# Patient Record
Sex: Female | Born: 1945
Health system: Southern US, Community
[De-identification: ages and names within clinical notes are randomized; demographics above are authoritative.]

## PROBLEM LIST (undated history)

## (undated) DIAGNOSIS — I10 Essential (primary) hypertension: Secondary | ICD-10-CM

## (undated) DIAGNOSIS — M1990 Unspecified osteoarthritis, unspecified site: Secondary | ICD-10-CM

## (undated) DIAGNOSIS — E119 Type 2 diabetes mellitus without complications: Secondary | ICD-10-CM

## (undated) HISTORY — PX: BREAST EXCISIONAL BIOPSY: SUR124

## (undated) HISTORY — PX: TONSILLECTOMY: SUR1361

## (undated) HISTORY — PX: OTHER SURGICAL HISTORY: SHX169

---

## 1976-12-14 HISTORY — PX: BREAST BIOPSY: SHX20

## 2008-06-26 ENCOUNTER — Emergency Department (HOSPITAL_COMMUNITY): Admission: EM | Admit: 2008-06-26 | Discharge: 2008-06-26 | Payer: Self-pay | Admitting: Emergency Medicine

## 2016-01-07 ENCOUNTER — Ambulatory Visit (INDEPENDENT_AMBULATORY_CARE_PROVIDER_SITE_OTHER): Payer: Medicare Other

## 2016-01-07 ENCOUNTER — Ambulatory Visit (INDEPENDENT_AMBULATORY_CARE_PROVIDER_SITE_OTHER): Payer: Medicare Other | Admitting: Podiatry

## 2016-01-07 ENCOUNTER — Encounter: Payer: Self-pay | Admitting: Podiatry

## 2016-01-07 VITALS — BP 140/77 | HR 92 | Resp 12

## 2016-01-07 DIAGNOSIS — M722 Plantar fascial fibromatosis: Secondary | ICD-10-CM | POA: Diagnosis not present

## 2016-01-07 DIAGNOSIS — E1142 Type 2 diabetes mellitus with diabetic polyneuropathy: Secondary | ICD-10-CM | POA: Diagnosis not present

## 2016-01-07 DIAGNOSIS — E119 Type 2 diabetes mellitus without complications: Secondary | ICD-10-CM

## 2016-01-07 MED ORDER — GABAPENTIN 300 MG PO CAPS: 270.0000 mg | ORAL_CAPSULE | Freq: Three times a day (TID) | ORAL | Status: DC

## 2016-01-08 NOTE — Progress Notes (Signed)
She presents today as a new patient chief complaint of pain and itching and medialized general arch bilateral foot. States that she is non-insulin-dependent diabetic currently taking gabapentin for itching and pain in the medial longitudinal arch. She states that this is been going on for several months and no treatment has alleviated her symptoms.  Objective: Vital signs are stable she is alert and oriented 3. I have reviewed her past mental history medications allergies surgery social history and review of systems. No apparent distress. Pulses are strongly palpable bilateral. Neurologic sensorium is intact per Semmes-Weinstein monofilament the loss of protective vibratory sensation. Muscle strength is 5 over 5 dorsiflexion 5 flexors and inverters everters on physical musculatures intact orthopedic evaluation doesn't straight hammertoe deformities bilateral. She has pain on palpation medially continue tubercle bilateral. Radiographs do demonstrate soft tissue increase in density of the plantar fascial calcaneal insertion site left greater than right. No fractures are identified.  Assessment: Diabetes mellitus with diabetic peripheral neuropathy. Chronic proximal plantar fasciitis bilateral heels.  Plan: Discussed etiology pathology conservative versus surgical therapies. Started her on plantar fascial braces injected her bilateral heels with Kenalog and local anesthetic and we will follow-up with her in the next few weeks for reevaluation.

## 2016-02-04 ENCOUNTER — Encounter: Payer: Self-pay | Admitting: Podiatry

## 2016-02-04 ENCOUNTER — Ambulatory Visit (INDEPENDENT_AMBULATORY_CARE_PROVIDER_SITE_OTHER): Payer: Medicare Other | Admitting: Podiatry

## 2016-02-04 VITALS — BP 145/79 | HR 93 | Resp 16

## 2016-02-04 DIAGNOSIS — E1142 Type 2 diabetes mellitus with diabetic polyneuropathy: Secondary | ICD-10-CM | POA: Diagnosis not present

## 2016-02-04 DIAGNOSIS — M722 Plantar fascial fibromatosis: Secondary | ICD-10-CM | POA: Diagnosis not present

## 2016-02-04 NOTE — Progress Notes (Signed)
She presents today for a follow-up of her neuropathy which she states is doing much better but she continues to have [place] little bit of itching on the bottom of the foot. She states that she takes her gabapentin on a regular basis as prescribed and that has done very well for her. She also presents for follow-up of her plantar fasciitis which she states is approximately 90% resolved. She still has some pain to the plantar medial heel but is limited.  Objective: Vital signs are stable she is alert and oriented 3 she has pain on palpation medial calcaneal tubercle. Otherwise no change and physical findings.  Assessment: Pain in limb secondary to neuropathy as well as plantar fasciitis right greater than left.  Plan: Reinjected the right heel today with 20 mg of Kenalog and local anesthetic. Encouraged her to continue her plantar fascial brace and night splint. I encouraged her to continue her gabapentin on a regular basis and notify us when the need should arise for refills.

## 2016-03-09 ENCOUNTER — Other Ambulatory Visit: Payer: Self-pay

## 2016-03-09 DIAGNOSIS — Z1231 Encounter for screening mammogram for malignant neoplasm of breast: Secondary | ICD-10-CM

## 2016-03-10 ENCOUNTER — Ambulatory Visit
Admission: RE | Admit: 2016-03-10 | Discharge: 2016-03-10 | Disposition: A | Discharge status: Home / Self Care | Payer: Medicare Other | Source: Ambulatory Visit

## 2016-03-10 DIAGNOSIS — Z1231 Encounter for screening mammogram for malignant neoplasm of breast: Secondary | ICD-10-CM

## 2016-04-02 ENCOUNTER — Encounter: Payer: Self-pay | Admitting: Podiatry

## 2016-04-02 ENCOUNTER — Ambulatory Visit (INDEPENDENT_AMBULATORY_CARE_PROVIDER_SITE_OTHER): Payer: Medicare Other | Admitting: Podiatry

## 2016-04-02 DIAGNOSIS — M722 Plantar fascial fibromatosis: Secondary | ICD-10-CM

## 2016-04-02 DIAGNOSIS — Q828 Other specified congenital malformations of skin: Secondary | ICD-10-CM | POA: Diagnosis not present

## 2016-04-02 DIAGNOSIS — M779 Enthesopathy, unspecified: Secondary | ICD-10-CM | POA: Diagnosis not present

## 2016-04-02 DIAGNOSIS — E1142 Type 2 diabetes mellitus with diabetic polyneuropathy: Secondary | ICD-10-CM

## 2016-04-04 NOTE — Progress Notes (Signed)
She presents today with a chief complaint of a painful lesion sub-fifth metatarsal of the right foot. She is also following up for plantar fasciitis bilateral. And diabetic peripheral neuropathy. He continues her gabapentin on a regular basis and states that she still has a little bit of itching on occasion. She states that she notices it to be worse with her elevated blood sugars. She states that the plantar fasciitis is doing much better but she would like to have me to trim the fifth metatarsal head area where calluses present.  Objective: Vital signs are stable alert and oriented 3. Pulses are palpable. Neurologic sensorium is intact per Semmes-Weinstein monofilament. No pain on palpation medial calcaneal tubercles bilateral. Reactive hyperkeratosis of fifth metatarsal head of the right foot with pain on end range of motion of the fifth metatarsophalangeal joint right foot.  Assessment: Diabetes mellitus with diabetic peripheral neuropathy. Resolving plantar fasciitis bilateral. Capsulitis fifth metatarsophalangeal joint right foot. This is possibly associated with the porokeratotic lesion plantar aspect of the fifth metatarsal head right foot.  Plan: Debrided all reactive hyperkeratosis today injected the fifth metatarsophalangeal joint today with dexamethasone and local anesthetic beneath the joint itself.

## 2016-05-28 ENCOUNTER — Ambulatory Visit (INDEPENDENT_AMBULATORY_CARE_PROVIDER_SITE_OTHER): Payer: Medicare Other | Admitting: Podiatry

## 2016-05-28 ENCOUNTER — Encounter: Payer: Self-pay | Admitting: Podiatry

## 2016-05-28 DIAGNOSIS — E1142 Type 2 diabetes mellitus with diabetic polyneuropathy: Secondary | ICD-10-CM

## 2016-05-28 DIAGNOSIS — M779 Enthesopathy, unspecified: Secondary | ICD-10-CM

## 2016-05-30 NOTE — Progress Notes (Signed)
She presents today for follow-up of capsulitis first metatarsophalangeal joint of the right foot where she has significant osteoarthritic change. She also takes gabapentin which seems to be helping but recently she's noted more pain in the forefoot so she really couldn't tell whether or not the gabapentin was doing any good. She says it seems to have lost its efficacy but the pain is different than it was.  Objective: Vital signs are stable she is alert and oriented 3. Pulses are palpable. Severe osteoarthritis first metatarsophalangeal joint. Hallux limitus first metatarsophalangeal joint. An diabetic peripheral neuropathy is painful.  Assessment: Diabetes mellitus with neuropathy. Moderate to severe capsulitis hallux limitus first metatarsophalangeal joint bilateral.  Plan: After sterile Betadine skin prep injected the areas today with dexamethasone and local anesthetic I recommended that we leave her gabapentin alone at 300 mg 3 times a day and I will follow-up with her in 1 month or so to recheck the gabapentin.

## 2016-06-02 ENCOUNTER — Ambulatory Visit: Payer: Medicare Other | Admitting: Podiatry

## 2016-07-02 ENCOUNTER — Encounter: Payer: Self-pay | Admitting: Podiatry

## 2016-07-02 ENCOUNTER — Ambulatory Visit (INDEPENDENT_AMBULATORY_CARE_PROVIDER_SITE_OTHER): Payer: Medicare Other | Admitting: Podiatry

## 2016-07-02 ENCOUNTER — Ambulatory Visit: Payer: Medicare Other | Admitting: Podiatry

## 2016-07-02 DIAGNOSIS — E1142 Type 2 diabetes mellitus with diabetic polyneuropathy: Secondary | ICD-10-CM | POA: Diagnosis not present

## 2016-07-02 MED ORDER — GABAPENTIN (ONCE-DAILY) 600 MG PO TABS: ORAL_TABLET | ORAL | Status: DC

## 2016-07-04 NOTE — Progress Notes (Signed)
She presents today for follow-up of her painful neuropathy. She states that she has good days and bad days however she is not sure that the 300 mg 3 times a day is actually helping her.  Objective: Vital signs are stable she is alert and oriented 3. No change in physical exam.  Assessment: Diabetic peripheral neuropathy.  Plan: I increased her gabapentin 600 mg 3 times a day I will follow up with her in 1 month to 6 weeks.

## 2016-07-07 ENCOUNTER — Telehealth: Payer: Self-pay | Admitting: *Deleted

## 2016-07-07 MED ORDER — GABAPENTIN 300 MG PO CAPS: 300.0000 mg | ORAL_CAPSULE | Freq: Three times a day (TID) | ORAL | 0 refills | Status: DC

## 2016-07-07 NOTE — Telephone Encounter (Signed)
Prior authorization request for [place] received.  Dr. Al Corpus states he did not order for pt, pt is to be on Gabapentin 300mg  #270 one capsule tid. Orders changed and escribed to [facility].

## 2016-07-30 ENCOUNTER — Ambulatory Visit: Payer: Medicare Other | Admitting: Podiatry

## 2016-08-27 ENCOUNTER — Ambulatory Visit (INDEPENDENT_AMBULATORY_CARE_PROVIDER_SITE_OTHER): Payer: Medicare Other | Admitting: Podiatry

## 2016-08-27 ENCOUNTER — Encounter: Payer: Self-pay | Admitting: Podiatry

## 2016-08-27 DIAGNOSIS — E1142 Type 2 diabetes mellitus with diabetic polyneuropathy: Secondary | ICD-10-CM | POA: Diagnosis not present

## 2016-08-29 NOTE — Progress Notes (Signed)
She presents today for follow-up of her gabapentin. 300 mg take 2 tablets 3 times a day total of 1800 mg daily. She states that she seems to be doing better when she takes that amount. That she still gets itching in her hands and her feet.  Objective: Vital signs are stable she is alert and oriented 3 no change in physical exam. Pulses remain palpable area  Assessment: Diabetic peripheral neuropathy bilateral.  Plan: Continue 1800 mg of gabapentin daily. We also requested a topical compound to be applied to the feet 2-3 times a day. This was ordered today and I will follow-up with her on an as-needed basis.

## 2017-02-03 ENCOUNTER — Other Ambulatory Visit: Payer: Self-pay | Admitting: Family Medicine

## 2017-02-03 DIAGNOSIS — Z1231 Encounter for screening mammogram for malignant neoplasm of breast: Secondary | ICD-10-CM

## 2017-03-15 ENCOUNTER — Ambulatory Visit (INDEPENDENT_AMBULATORY_CARE_PROVIDER_SITE_OTHER): Payer: Medicare Other | Admitting: Podiatry

## 2017-03-15 ENCOUNTER — Ambulatory Visit (INDEPENDENT_AMBULATORY_CARE_PROVIDER_SITE_OTHER): Payer: Medicare Other

## 2017-03-15 DIAGNOSIS — M722 Plantar fascial fibromatosis: Secondary | ICD-10-CM | POA: Diagnosis not present

## 2017-03-15 DIAGNOSIS — G629 Polyneuropathy, unspecified: Secondary | ICD-10-CM

## 2017-03-15 MED ORDER — TRIAMCINOLONE ACETONIDE 10 MG/ML IJ SUSP
10.0000 mg | Freq: Once | INTRAMUSCULAR | Status: AC
  Administered 2017-03-15: 10 mg

## 2017-03-15 NOTE — Progress Notes (Signed)
Subjective:     Patient ID: April Jones, female   DOB: [birth year], 71 y.o.   MRN: [id number]  HPI patient presents stating that my left heel has really started to bother me and I get burning in my feet   Review of Systems     Objective:   Physical Exam Neurovascular status is stable from previous visit with patient noted to have discomfort in the left plantar fascia at the insertional point calcaneus with also neuropathic issues consistent with neuropathy    Assessment:     Hopeful acute plantar fasciitis is part of her problem along with neuropathy    Plan:     Explained condition and careful injection administered plantar along with fascial brace and she can double up on her gabapentin at nighttime

## 2017-03-16 ENCOUNTER — Telehealth: Payer: Self-pay | Admitting: Podiatry

## 2017-03-16 MED ORDER — TRAMADOL HCL 50 MG PO TABS: 50.0000 mg | ORAL_TABLET | Freq: Three times a day (TID) | ORAL | 0 refills | Status: DC | PRN

## 2017-03-16 NOTE — Telephone Encounter (Addendum)
Pt states she is in severe pain in the heel and the shot has not helped and would like alternative treatment. I told pt to be consistent with ice therapy 3-4 times daily 15-20 minutes per session and I would ask the doctor on call for a medication for the inflammation and the pain. Orders called to [facility] [id number].

## 2017-03-16 NOTE — Addendum Note (Signed)
Addended by: Alphia Kava D on: 03/16/2017 04:36 PM   Modules accepted: Orders

## 2017-03-16 NOTE — Telephone Encounter (Signed)
Pt. Called and said that she Is in so much pain. I told her to try ice, she said it has not worked. She said the injection is not working. I told her I would have Val call her back and they could discuss other options. FYI. She uses the [employer] [facility] [employer] and [facility].

## 2017-03-16 NOTE — Telephone Encounter (Signed)
Is her pain more from the heel or is it more from the neuropathy (burning, sharp pain). If it is more from the plantar fasciitis then we can do ultram  BID. If it is more from the neuropathy we may be able to increase her gabapentin or add a compound cream for neuropathy.

## 2017-03-17 ENCOUNTER — Ambulatory Visit
Admission: RE | Admit: 2017-03-17 | Discharge: 2017-03-17 | Disposition: A | Discharge status: Home / Self Care | Payer: Medicare Other | Source: Ambulatory Visit | Attending: Family Medicine | Admitting: Family Medicine

## 2017-03-17 DIAGNOSIS — Z1231 Encounter for screening mammogram for malignant neoplasm of breast: Secondary | ICD-10-CM

## 2017-03-18 ENCOUNTER — Encounter: Payer: Self-pay | Admitting: Podiatry

## 2017-03-18 ENCOUNTER — Ambulatory Visit (INDEPENDENT_AMBULATORY_CARE_PROVIDER_SITE_OTHER): Payer: Medicare Other | Admitting: Podiatry

## 2017-03-18 ENCOUNTER — Telehealth: Payer: Self-pay | Admitting: *Deleted

## 2017-03-18 DIAGNOSIS — G629 Polyneuropathy, unspecified: Secondary | ICD-10-CM | POA: Diagnosis not present

## 2017-03-18 DIAGNOSIS — M722 Plantar fascial fibromatosis: Secondary | ICD-10-CM

## 2017-03-18 MED ORDER — HYDROCODONE-ACETAMINOPHEN 5-325 MG PO TABS: 1.0000 | ORAL_TABLET | Freq: Four times a day (QID) | ORAL | 0 refills | Status: DC | PRN

## 2017-03-18 NOTE — Telephone Encounter (Signed)
Pt called states she is still having pain even on the gabapentin and tramadol, does she need an appt. I reviewed clinicals and pt was seen today at 2:45pm.

## 2017-03-18 NOTE — Patient Instructions (Signed)
Do not take the tramadol. I have prescribed you vicodin.   Keep neosporin on the cut under your left 5th toe.   Try increasing your gabapentin to  at night

## 2017-03-19 ENCOUNTER — Telehealth: Payer: Self-pay | Admitting: Podiatry

## 2017-03-19 NOTE — Progress Notes (Signed)
Subjective: 71 year old female presents the office today for follow-up evaluation of plantar fasciitis and neuropathy. She presents today as a walk-in patient due to pain. She states that it is a throbbing, itching sensation to her toes and because of that she has difficulty sleeping at night. She was started on tramadol early this week with this has not been helping. She is currently taking 600 mg a gabapentin 3 times a day. She states that this is working up until last week. She does get some relief after the heel injection but the toe as she was still causing her not to sleep and continued pain. Denies any systemic complaints such as fevers, chills, nausea, vomiting. No acute changes since last appointment, and no other complaints at this time.   Objective: AAO x3, NAD DP/PT pulses palpable bilaterally, CRT less than 3 seconds Sensation decreased with Simms Weinstein monofilament. There is no area pinpoint tenderness or pain the vibratory sensation to her feet. There is no pain on the course or insertion of plantar fascial today. There is no overlying edema, erythema, increase in warmth. Subjective she is complaining of itching, throbbing pain to her all of her toes. There is a small skin fissure in the sulcus the left fifth toe. No drainage or pus or swelling or redness or any other signs of infection. No open lesions or pre-ulcerative lesions.  No pain with calf compression, swelling, warmth, erythema  Assessment: 71 year old female likely neuropathy symptoms; skin fissure left foot  Plan: -All treatment options discussed with the patient including all alternatives, risks, complications.  -Discontinue tramadol and I prescribed Vicodin due to pain however I also discussed with her child increase her gabapentin 900 mg at nighttime to see if this will help with her pain at night that she is having. She is not taking these medications in combination she understood this. -Castellani's paint was  Applied to Left Foot Skin Fissure Today. Continue with Interbody Ointment Dressing Changes Daily. -We will see her back next couple weeks or sooner if needed. I encouraged her to call any questions or concerns, or change in symptoms.   Ovid Curd, DPM

## 2017-03-19 NOTE — Telephone Encounter (Signed)
Pt called and said the pain medication that she was given yesterday worked but gave her upset stomach.

## 2017-03-19 NOTE — Telephone Encounter (Signed)
Pt called and states she had diarrhea after taking hydrocodone. I told pt to take the medication with a meal and see if helped. Pt agreed.

## 2017-04-08 ENCOUNTER — Ambulatory Visit: Payer: Medicare Other | Admitting: Podiatry

## 2017-04-13 ENCOUNTER — Encounter: Payer: Self-pay | Admitting: Podiatry

## 2017-04-13 ENCOUNTER — Ambulatory Visit (INDEPENDENT_AMBULATORY_CARE_PROVIDER_SITE_OTHER): Payer: Medicare Other | Admitting: Podiatry

## 2017-04-13 DIAGNOSIS — E1142 Type 2 diabetes mellitus with diabetic polyneuropathy: Secondary | ICD-10-CM | POA: Diagnosis not present

## 2017-04-13 MED ORDER — GABAPENTIN 300 MG PO CAPS: ORAL_CAPSULE | ORAL | 3 refills | Status: DC

## 2017-04-13 NOTE — Progress Notes (Signed)
She presents today with a follow-up of her neuropathy states that she still having trouble. She states that taking 900 mg at nighttime seems to be helping.  Objective: Vital signs are stable she is alert and oriented 3. No change in neurologic evaluation pulses remain palpable. No open lesions or wounds. Assessment: Diabetic peripheral neuropathy.  Plan: I'm going to encourage her to continue her gabapentin she will take 600 in the morning 600 at lunch time and 900 at bedtime. I will follow-up with her in a couple of months to make sure she is doing well.

## 2017-09-23 ENCOUNTER — Encounter: Payer: Self-pay | Admitting: Podiatry

## 2017-09-23 ENCOUNTER — Ambulatory Visit (INDEPENDENT_AMBULATORY_CARE_PROVIDER_SITE_OTHER): Payer: Medicare Other | Admitting: Podiatry

## 2017-09-23 DIAGNOSIS — E1142 Type 2 diabetes mellitus with diabetic polyneuropathy: Secondary | ICD-10-CM

## 2017-09-23 DIAGNOSIS — M79676 Pain in unspecified toe(s): Secondary | ICD-10-CM | POA: Diagnosis not present

## 2017-09-23 MED ORDER — HYDROCODONE-ACETAMINOPHEN 5-325 MG PO TABS: 1.0000 | ORAL_TABLET | Freq: Three times a day (TID) | ORAL | 0 refills | Status: DC

## 2017-09-23 NOTE — Progress Notes (Addendum)
She presents today for follow-up of her diabetic peripheral neuropathy. She states that she has been doing very well with her medication 600 mg of gabapentin in the morning and 600 mg of gabapentin at lunchtime and 900 mg at nighttime. She states that recently her great toes have been having sharp shooting pains and only at night but also in the daytime. She states and 16 seconds in the bottom and the only thing that makes it feel better straight abruptly toes are scratched on the carpet.  Objective: Vital signs are stable alert and oriented 3. Pulses are palpable. Neurologic sensorium is intact deep tendon reflexes are intact muscle strength is normal symmetrical bilateral. No change in physical exam.  Assessment: Diabetic peripheral neuropathy.  Plan: Therapeutic nerve block bilateral hallux. We will leave her medications the same. I did provide her with hydrocodone. We also wrote a prescription for [employer]

## 2017-09-24 ENCOUNTER — Encounter: Payer: Self-pay | Admitting: Neurology

## 2017-09-27 ENCOUNTER — Other Ambulatory Visit: Payer: Self-pay | Admitting: *Deleted

## 2017-09-27 DIAGNOSIS — G629 Polyneuropathy, unspecified: Secondary | ICD-10-CM

## 2017-10-07 ENCOUNTER — Ambulatory Visit (INDEPENDENT_AMBULATORY_CARE_PROVIDER_SITE_OTHER): Payer: Medicare Other | Admitting: Neurology

## 2017-10-07 DIAGNOSIS — G629 Polyneuropathy, unspecified: Secondary | ICD-10-CM | POA: Diagnosis not present

## 2017-10-07 DIAGNOSIS — M5417 Radiculopathy, lumbosacral region: Secondary | ICD-10-CM

## 2017-10-07 NOTE — Procedures (Signed)
[facility]  [address] [place], [address]  [place], [place] [address] Tel: [phone] Fax:  [phone] Test Date:  10/07/2017  Patient: Margareth Kanner DOB: [birth year] Physician: Nita Sickle, DO  Sex: Female Height: 5\' 2"  Ref Phys: Darrow Bussing, M.D.  ID#: [id number] Temp: 35.6C Technician:    Patient Complaints: This is a 71 year-old female referred for evaluation of bilateral feet pain and paresthesias.  NCV & EMG Findings: Extensive electrodiagnostic testing of the left lower extremity and additional studies of the right shows:  1. Bilateral sural and superficial peroneal sensory responses are within normal limits. 2. Bilateral peroneal (EDB) tibial motor responses are absent. Bilateral peroneal motor responses recording at the tibialis anterior within normal limits. 3. Left tibial H reflex study is prolonged 4. Chronic motor axon loss changes are seen affecting the L5-S1 myotomes bilaterally.  Active denervation seen in the left lumbosacral paraspinal, tibialis anterior, and flexor digitorum longus muscles.  Impression: 1. Chronic L5 radiculopathy affecting bilateral lower extremities, moderate in degree electrically, and worse on the left where there is evidence of active denervation. 2. Chronic S1 radiculopathy affecting bilateral lower extremities, mild in degree electrically. 3. There is no evidence of large fiber sensorimotor polyneuropathy.   ___________________________ Nita Sickle, DO    Nerve Conduction Studies Anti Sensory Summary Table   Stim Site NR Peak (ms) Norm Peak (ms) P-T Amp (V) Norm P-T Amp  Left Sup Peroneal Anti Sensory (Ant Lat Mall)  12 cm    3.6 <4.6 13.3 >3  Right Sup Peroneal Anti Sensory (Ant Lat Mall)  12 cm    2.8 <4.6 13.8 >3  Left Sural Anti Sensory (Lat Mall)  Calf    3.1 <4.6 14.4 >3  Right Sural Anti Sensory (Lat Mall)  Calf    3.6 <4.6 10.2 >3   Motor Summary Table   Stim Site NR Onset (ms) Norm Onset (ms) O-P Amp (mV)  Norm O-P Amp Site1 Site2 Delta-0 (ms) Dist (cm) Vel (m/s) Norm Vel (m/s)  Left Peroneal Motor (Ext Dig Brev)  Ankle NR  <6.0  >2.5 B Fib Ankle  0.0  >40  B Fib NR     Poplt B Fib  0.0  >40  Poplt NR            Right Peroneal Motor (Ext Dig Brev)  Ankle NR  <6.0  >2.5 B Fib Ankle  0.0  >40  B Fib NR            Left Peroneal TA Motor (Tib Ant)  Fib Head    3.4 <4.5 5.2 >3 Poplit Fib Head 1.1 8.0 73 >40  Poplit    4.5  5.0         Right Peroneal TA Motor (Tib Ant)  Fib Head    2.9 <4.5 6.9 >3 Poplit Fib Head 1.1 8.0 73 >40  Poplit    4.0  6.2         Left Tibial Motor (Abd Hall Brev)  Ankle NR  <6.0  >4 Knee Ankle  0.0  >40  Knee NR            Right Tibial Motor (Abd Hall Brev)  Ankle NR  <6.0  >4 Knee Ankle  0.0  >40  Knee NR             H Reflex Studies   NR H-Lat (ms) Lat Norm (ms) L-R H-Lat (ms)  Left Tibial (Gastroc)     40.00 <35  EMG   Side Muscle Ins Act Fibs Psw Fasc Number Recrt Dur Dur. Amp Amp. Poly Poly. Comment  Left Lumbo Parasp Low Nml 1+ Nml Nml NE - - - - - - - N/A  Left Gastroc Nml Nml Nml Nml 1- Rapid Few 1+ Few 1+ Nml Nml N/A  Left Flex Dig Long Nml 1+ Nml Nml 2- Rapid Many 1+ Many 1+ Nml Nml N/A  Left RectFemoris Nml Nml Nml Nml Nml Nml Nml Nml Nml Nml Nml Nml N/A  Right Flex Dig Long Nml Nml Nml Nml Nml Nml Nml Nml Nml Nml Nml Nml N/A  Right RectFemoris Nml Nml Nml Nml Nml Nml Nml Nml Nml Nml Nml Nml N/A  Right Gastroc Nml Nml Nml Nml Nml Nml Nml Nml Nml Nml Nml Nml N/A  Right AntTibialis Nml Nml Nml Nml Nml Nml Nml Nml Nml Nml Nml Nml N/A  Left BicepsFemS Nml Nml Nml Nml 1- Rapid Some 1+ Some 1+ Nml Nml N/A  Left GluteusMed Nml Nml Nml Nml 1- Rapid Some 1+ Some 1+ Nml Nml N/A  Left AntTibialis Nml 1+ Nml Nml 2- Rapid Some 1+ Some 1+ Some 1+ N/A      Waveforms:

## 2017-10-19 ENCOUNTER — Ambulatory Visit: Payer: Medicare Other | Admitting: Podiatry

## 2017-10-27 ENCOUNTER — Other Ambulatory Visit: Payer: Self-pay | Admitting: Family Medicine

## 2017-10-27 DIAGNOSIS — M5410 Radiculopathy, site unspecified: Secondary | ICD-10-CM

## 2017-11-12 ENCOUNTER — Other Ambulatory Visit: Payer: Medicare Other

## 2017-11-16 ENCOUNTER — Encounter: Payer: Self-pay | Admitting: Podiatry

## 2017-11-16 ENCOUNTER — Ambulatory Visit: Payer: Medicare Other | Admitting: Podiatry

## 2017-11-16 DIAGNOSIS — E1142 Type 2 diabetes mellitus with diabetic polyneuropathy: Secondary | ICD-10-CM | POA: Diagnosis not present

## 2017-11-16 MED ORDER — DULOXETINE HCL 30 MG PO CPEP: 30.0000 mg | ORAL_CAPSULE | Freq: Every day | ORAL | 3 refills | Status: DC

## 2017-11-16 NOTE — Progress Notes (Signed)
She presents today complaining of severe itching to the plantar aspect of the bilateral foot.  She has a history of diabetic peripheral neuropathy is currently taking 2100 mg of gabapentin daily.  Objective: Vital signs are stable she is alert and oriented x3 pulses are palpable.  Neurologic sensorium is diminished per Semmes Weinstein monofilament.  No open lesions or wounds to the bottom of the foot no rashes or odor.  Assessment: Diabetic peripheral neuropathy with severe itching.  Plan: At this point will start her on Cymbalta 30 mg 1 tablet p.o. twice daily.  I recommended that she start with 1 tablet just at nighttime until she can make sure that she is tolerating it well that she can take 1 tablet in the morning and 1 at night.  We did discuss the possible side effects of this medication and will follow up with her in the near future.

## 2017-11-18 ENCOUNTER — Ambulatory Visit: Payer: Medicare Other | Admitting: Podiatry

## 2017-11-18 ENCOUNTER — Telehealth: Payer: Self-pay

## 2017-11-18 NOTE — Telephone Encounter (Signed)
Patient called stating that she has taken 2 doses of the Cymbalta as prescribed but she is still having pain and itching in both toes, she says the pain has worsened.

## 2017-11-18 NOTE — Telephone Encounter (Signed)
Its going to take about a week if it is going to help.  If that does not help her then have her in for a shot.

## 2017-11-18 NOTE — Telephone Encounter (Signed)
Informed patient of Dr. Geryl RankinsHyatt's response, LVM

## 2017-12-02 ENCOUNTER — Ambulatory Visit: Payer: Medicare Other | Admitting: Podiatry

## 2017-12-02 ENCOUNTER — Encounter: Payer: Self-pay | Admitting: Podiatry

## 2017-12-02 DIAGNOSIS — M7750 Other enthesopathy of unspecified foot: Secondary | ICD-10-CM

## 2017-12-02 DIAGNOSIS — E1142 Type 2 diabetes mellitus with diabetic polyneuropathy: Secondary | ICD-10-CM | POA: Diagnosis not present

## 2017-12-02 DIAGNOSIS — M779 Enthesopathy, unspecified: Secondary | ICD-10-CM

## 2017-12-02 DIAGNOSIS — M778 Other enthesopathies, not elsewhere classified: Secondary | ICD-10-CM

## 2017-12-02 NOTE — Progress Notes (Signed)
She presents today for follow-up of her diabetic neuropathy states that he was doing quite well with the new medication until just of the day and had attack of these second toes bilaterally which were extremely painful.  Objective: Vital signs are stable she is alert and oriented x3.  Pulses are palpable.  She has no reproducible pain on palpation of the second metatarsal phalangeal joint area though she has had capsulitis there in the past.  Assessment: Diabetic peripheral neuropathy with capsulitis of the second metatarsophalangeal joints bilaterally.  Plan: I injected 4 mg of dexamethasone and local anesthetic to the point of maximal tenderness today after sterile Betadine skin prep.  Tolerated procedure well after his stated that they were feeling just fine.  I will follow-up with her at her regular scheduled appointment.

## 2017-12-21 ENCOUNTER — Encounter: Payer: Self-pay | Admitting: Podiatry

## 2017-12-21 ENCOUNTER — Ambulatory Visit: Payer: Medicare Other | Admitting: Podiatry

## 2017-12-21 DIAGNOSIS — M779 Enthesopathy, unspecified: Secondary | ICD-10-CM

## 2017-12-21 DIAGNOSIS — E1142 Type 2 diabetes mellitus with diabetic polyneuropathy: Secondary | ICD-10-CM

## 2017-12-21 DIAGNOSIS — M7750 Other enthesopathy of unspecified foot: Secondary | ICD-10-CM | POA: Diagnosis not present

## 2017-12-21 DIAGNOSIS — M778 Other enthesopathies, not elsewhere classified: Secondary | ICD-10-CM

## 2017-12-21 NOTE — Progress Notes (Signed)
She presents today for follow-up of her capsulitis to the second metatarsal phalangeal joint bilaterally and her neuropathy.  She states that she is doing very well with the neuropathy in the plantar capsulitis has not bothered her at all.  She states that it seems to have resolved completely.  Objective: Pulses are strongly palpable today no open lesions or wounds no reproduction of the pain on range of motion of the second metatarsophalangeal joints.  Assessment: Well-healing capsulitis second MTPJ's bilateral.  Plan: Follow-up with us on an as-needed basis.

## 2018-02-07 ENCOUNTER — Other Ambulatory Visit: Payer: Self-pay | Admitting: Family Medicine

## 2018-02-07 DIAGNOSIS — Z1231 Encounter for screening mammogram for malignant neoplasm of breast: Secondary | ICD-10-CM

## 2018-02-09 ENCOUNTER — Encounter: Payer: Self-pay | Admitting: Family Medicine

## 2018-02-10 ENCOUNTER — Other Ambulatory Visit: Payer: Self-pay | Admitting: Family Medicine

## 2018-02-10 DIAGNOSIS — G629 Polyneuropathy, unspecified: Secondary | ICD-10-CM

## 2018-02-10 DIAGNOSIS — M5416 Radiculopathy, lumbar region: Secondary | ICD-10-CM

## 2018-02-25 ENCOUNTER — Ambulatory Visit
Admission: RE | Admit: 2018-02-25 | Discharge: 2018-02-25 | Disposition: A | Discharge status: Home / Self Care | Payer: Medicare Other | Source: Ambulatory Visit | Attending: Family Medicine | Admitting: Family Medicine

## 2018-02-25 DIAGNOSIS — M5416 Radiculopathy, lumbar region: Secondary | ICD-10-CM

## 2018-02-25 DIAGNOSIS — G629 Polyneuropathy, unspecified: Secondary | ICD-10-CM

## 2018-03-15 ENCOUNTER — Ambulatory Visit: Payer: Medicare Other | Admitting: Podiatry

## 2018-03-15 ENCOUNTER — Encounter: Payer: Self-pay | Admitting: Podiatry

## 2018-03-15 DIAGNOSIS — M779 Enthesopathy, unspecified: Principal | ICD-10-CM

## 2018-03-15 DIAGNOSIS — M722 Plantar fascial fibromatosis: Secondary | ICD-10-CM

## 2018-03-15 DIAGNOSIS — M7750 Other enthesopathy of unspecified foot: Secondary | ICD-10-CM

## 2018-03-15 DIAGNOSIS — M778 Other enthesopathies, not elsewhere classified: Secondary | ICD-10-CM

## 2018-03-15 NOTE — Progress Notes (Signed)
She presents today chief complaint of pain to the plantar fifth metatarsal phalangeal joint area over the weekend she states that it was so bad she can hardly stand it she does relate that her blood sugars have been sort of up and down recently and her primary doctor is changing her from her 1 medication to another in hopes that she will be better able to maintain a normal sugar.  Also she is complaining of left heel pain states is been bothering her now for the last couple of days.  Objective: Vital signs are stable she is alert and oriented x3 she does have pain to palpation medial calcaneal tubercle of her left heel pulses remain palpable and strong she has tenderness on palpation of the fifth metatarsal phalangeal joint of the right foot and to that fourth intermetatarsal space I imagine some of this is associated with neuropathy but at this point is very hard to tell so we need to treat at all.  Assessment: Plantar fasciitis left neuropathy bilateral capsulitis neuritis fifth metatarsophalangeal joint right.  Plan: After sterile Betadine skin prep to the bilateral foot I injected 2 mg of dexamethasone and local anesthetic to the fifth metatarsal phalangeal joint area of the right foot tolerated procedure well and injected dorsally.  Next I injected the left heel 20 mg Kenalog 5 mg Marcaine point maximal tenderness of the left heel tolerated procedure well without comp occasions.  I will follow-up with her on an as-needed basis I expressed to her how important it is to maintain steady blood sugar so that she does not have issues with neuropathy balancing outside of her therapeutic range of Neurontin.  She understands and is amenable to it also discussed the possibility of a food diary to help with her sharp fluctuations.

## 2018-03-23 ENCOUNTER — Ambulatory Visit
Admission: RE | Admit: 2018-03-23 | Discharge: 2018-03-23 | Disposition: A | Discharge status: Home / Self Care | Payer: Medicare Other | Source: Ambulatory Visit | Attending: Family Medicine | Admitting: Family Medicine

## 2018-03-23 DIAGNOSIS — Z1231 Encounter for screening mammogram for malignant neoplasm of breast: Secondary | ICD-10-CM

## 2018-04-11 ENCOUNTER — Encounter: Payer: Self-pay | Admitting: Family Medicine

## 2018-04-21 ENCOUNTER — Other Ambulatory Visit: Payer: Self-pay | Admitting: Podiatry

## 2018-05-11 DIAGNOSIS — M792 Neuralgia and neuritis, unspecified: Secondary | ICD-10-CM | POA: Insufficient documentation

## 2018-06-08 ENCOUNTER — Encounter: Payer: Self-pay | Admitting: Family Medicine

## 2018-07-03 ENCOUNTER — Other Ambulatory Visit: Payer: Self-pay | Admitting: Podiatry

## 2018-07-07 ENCOUNTER — Other Ambulatory Visit: Payer: Self-pay | Admitting: Podiatry

## 2018-07-13 ENCOUNTER — Encounter: Payer: Medicare Other | Attending: Family Medicine | Admitting: *Deleted

## 2018-07-13 DIAGNOSIS — E1142 Type 2 diabetes mellitus with diabetic polyneuropathy: Secondary | ICD-10-CM | POA: Insufficient documentation

## 2018-07-13 DIAGNOSIS — E1165 Type 2 diabetes mellitus with hyperglycemia: Secondary | ICD-10-CM

## 2018-07-13 DIAGNOSIS — E118 Type 2 diabetes mellitus with unspecified complications: Secondary | ICD-10-CM

## 2018-07-13 DIAGNOSIS — IMO0002 Reserved for concepts with insufficient information to code with codable children: Secondary | ICD-10-CM

## 2018-07-13 DIAGNOSIS — Z713 Dietary counseling and surveillance: Secondary | ICD-10-CM | POA: Insufficient documentation

## 2018-07-13 NOTE — Progress Notes (Signed)
Diabetes Self-Management Education  Visit Type: First/Initial  Appt. Start Time: 0930 Appt. End Time: 1100  07/13/2018  [name], identified by name and date of birth, is a 72 y.o. female with a diagnosis of Diabetes: Type 2. She has had Diabetes for almost 10 years, eating habits appear appropriate most of the time and she is already exercising 3 days a week at [facility] with water exercises and yoga. Her MD has added a new DM medication and BG are improving now.   ASSESSMENT  There were no vitals taken for this visit. There is no height or weight on file to calculate BMI.  Diabetes Self-Management Education - 07/13/18 0918      Visit Information   Visit Type  First/Initial      Initial Visit   Diabetes Type  Type 2    Are you currently following a meal plan?  No    Are you taking your medications as prescribed?  Yes    Date Diagnosed  2010      Health Coping   How would you rate your overall health?  Good      Psychosocial Assessment   Patient Belief/Attitude about Diabetes  Motivated to manage diabetes    Self-care barriers  None    Other persons present  Patient    Patient Concerns  Glycemic Control;Nutrition/Meal planning;Medication    Special Needs  None    How often do you need to have someone help you when you read instructions, pamphlets, or other written materials from your doctor or pharmacy?  1 - Never    What is the last grade level you completed in school?  12      Pre-Education Assessment   Patient understands the diabetes disease and treatment process.  Needs Instruction    Patient understands incorporating nutritional management into lifestyle.  Needs Review    Patient undertands incorporating physical activity into lifestyle.  Needs Review    Patient understands using medications safely.  Needs Instruction    Patient understands monitoring blood glucose, interpreting and using results  Needs Review    Patient understands prevention, detection, and  treatment of acute complications.  Needs Review    Patient understands prevention, detection, and treatment of chronic complications.  Needs Review    Patient understands how to develop strategies to address psychosocial issues.  Needs Review    Patient understands how to develop strategies to promote health/change behavior.  Needs Review      Complications   Last HgB A1C per patient/outside source  8.4 %    How often do you check your blood sugar?  1-2 times/day    Fasting Blood glucose range (mg/dL)  16-109    Number of hypoglycemic episodes per month  0    Have you had a dilated eye exam in the past 12 months?  Yes    Are you checking your feet?  Yes    How many days per week are you checking your feet?  7      Dietary Intake   Breakfast  regular oatmeal and 1 toast OR corn flakes OR toast with jelly with fresh fruit if going to gym that day    Snack (morning)  black cherries, or other fresh fruits, nuts    Lunch  vaires, goes out with friends from gym - eats half order of chicken salad on lettuce leaf with macaroni salad OR hot dog on bun (1/2 of bread)     Snack (afternoon)  same as AM    Dinner  eats out often, meal salad OR meat, starch and vegetable meal    Snack (evening)  unsweetened applesauce     Beverage(s)  coffee with flavored creamer, stopped regular gingerale, water, diluted apple juice      Exercise   Exercise Type  Light (walking / raking leaves) gym water yoga and chair yoga    How many days per week to you exercise?  3    How many minutes per day do you exercise?  45    Total minutes per week of exercise  135      Patient Education   Disease state   Factors that contribute to the development of diabetes;Definition of diabetes, type 1 and 2, and the diagnosis of diabetes    Nutrition management   Role of diet in the treatment of diabetes and the relationship between the three main macronutrients and blood glucose level;Carbohydrate counting;Food label reading,  portion sizes and measuring food.    Physical activity and exercise   Role of exercise on diabetes management, blood pressure control and cardiac health.    Medications  Reviewed patients medication for diabetes, action, purpose, timing of dose and side effects.    Monitoring  Purpose and frequency of SMBG.;Identified appropriate SMBG and/or A1C goals.    Chronic complications  Relationship between chronic complications and blood glucose control    Psychosocial adjustment  Role of stress on diabetes      Individualized Goals (developed by patient)   Nutrition  Follow meal plan discussed    Physical Activity  Exercise 3-5 times per week    Medications  take my medication as prescribed    Monitoring   test blood glucose pre and post meals as discussed      Post-Education Assessment   Patient understands the diabetes disease and treatment process.  Demonstrates understanding / competency    Patient understands incorporating nutritional management into lifestyle.  Demonstrates understanding / competency    Patient undertands incorporating physical activity into lifestyle.  Demonstrates understanding / competency    Patient understands using medications safely.  Demonstrates understanding / competency    Patient understands monitoring blood glucose, interpreting and using results  Demonstrates understanding / competency    Patient understands prevention, detection, and treatment of acute complications.  Demonstrates understanding / competency    Patient understands prevention, detection, and treatment of chronic complications.  Demonstrates understanding / competency    Patient understands how to develop strategies to address psychosocial issues.  Demonstrates understanding / competency    Patient understands how to develop strategies to promote health/change behavior.  Demonstrates understanding / competency      Outcomes   Expected Outcomes  Demonstrated interest in learning. Expect positive  outcomes    Future DMSE  PRN    Program Status  Completed       Individualized Plan for Diabetes Self-Management Training:   Learning Objective:  Patient will have a greater understanding of diabetes self-management. Patient education plan is to attend individual and/or group sessions per assessed needs and concerns.   Plan:   Patient Instructions  Plan:  Aim for 2 Carb Choices per meal (30 grams) +/- 1 either way  Aim for 0-1 Carbs per snack if hungry  Include protein in moderation with your meals and snacks Consider reading food labels for Total Carbohydrate of foods Continue with your activity level by water exercises and chair yoga as tolerated, GREAT JOB! Consider checking BG at  alternate times per day   Continue taking medication as directed by MD  Expected Outcomes:  Demonstrated interest in learning. Expect positive outcomes  Education material provided: Food label handouts, A1C conversion sheet, Meal plan card and Carbohydrate counting sheet  If problems or questions, patient to contact team via:  Phone  Future DSME appointment: PRN

## 2018-07-13 NOTE — Patient Instructions (Signed)
Plan:  Aim for 2 Carb Choices per meal (30 grams) +/- 1 either way  Aim for 0-1 Carbs per snack if hungry  Include protein in moderation with your meals and snacks Consider reading food labels for Total Carbohydrate of foods Continue with your activity level by water exercises and chair yoga as tolerated, GREAT JOB! Consider checking BG at alternate times per day   Continue taking medication as directed by MD

## 2018-08-09 ENCOUNTER — Ambulatory Visit (INDEPENDENT_AMBULATORY_CARE_PROVIDER_SITE_OTHER): Payer: Medicare Other | Admitting: Podiatry

## 2018-08-09 ENCOUNTER — Telehealth: Payer: Self-pay | Admitting: *Deleted

## 2018-08-09 ENCOUNTER — Encounter: Payer: Self-pay | Admitting: Podiatry

## 2018-08-09 DIAGNOSIS — M722 Plantar fascial fibromatosis: Secondary | ICD-10-CM | POA: Diagnosis not present

## 2018-08-09 DIAGNOSIS — E1142 Type 2 diabetes mellitus with diabetic polyneuropathy: Secondary | ICD-10-CM | POA: Diagnosis not present

## 2018-08-09 DIAGNOSIS — G629 Polyneuropathy, unspecified: Secondary | ICD-10-CM

## 2018-08-09 MED ORDER — LIDOCAINE 5 % EX PTCH: 1.0000 | MEDICATED_PATCH | CUTANEOUS | 6 refills | Status: DC

## 2018-08-09 NOTE — Progress Notes (Signed)
She presents today states that the neuropathy is getting unbearable she states on taking more medication and I am supposed to be taking.  She states that she still has that each to her bilateral foot and right seems to be worse.  She states that the neuropathy and it seems to be moving up the leg.  She is also complaining of painful left heel.  Objective: Vital signs are stable she is left she is alert and oriented x3.  Neurologic sensorium is diminished per Semmes Weinstein monofilament bilateral lower extremities.  She has pain on palpation medial calcaneal tubercle of left heel.  Assessment: Pain in limb center onychomycosis.  Diabetic peripheral neuropathy.  Plan: I recommended that she follow-up with Dr. Odette FractionPaul Harkins for evaluation and possible stimulator or at least pain management.  I feel that he can treat this better than I can treat this at this point.  I did inject her left heel today with 20 mg Kenalog 5 mg Marcaine after sterile Betadine skin prep.

## 2018-08-09 NOTE — Telephone Encounter (Signed)
-----   Message from Kristian CoveyAshley E Prevette, [facility] sent at 08/09/2018 11:28 AM EDT ----- Regarding: Neuro Referral to Dr. Ollen BowlHarkins - diabetic neuropathy

## 2018-08-09 NOTE — Telephone Encounter (Signed)
Faxed required form, clinicals and demographics to [facility]. 

## 2019-02-10 ENCOUNTER — Other Ambulatory Visit: Payer: Self-pay | Admitting: Family Medicine

## 2019-02-10 DIAGNOSIS — Z1231 Encounter for screening mammogram for malignant neoplasm of breast: Secondary | ICD-10-CM

## 2019-03-27 ENCOUNTER — Ambulatory Visit: Payer: Medicare Other

## 2019-05-17 ENCOUNTER — Ambulatory Visit: Payer: Medicare Other

## 2019-06-26 ENCOUNTER — Ambulatory Visit
Admission: RE | Admit: 2019-06-26 | Discharge: 2019-06-26 | Disposition: A | Discharge status: Home / Self Care | Payer: Medicare Other | Source: Ambulatory Visit | Attending: Family Medicine | Admitting: Family Medicine

## 2019-06-26 ENCOUNTER — Other Ambulatory Visit: Payer: Self-pay

## 2019-06-26 DIAGNOSIS — Z1231 Encounter for screening mammogram for malignant neoplasm of breast: Secondary | ICD-10-CM

## 2019-09-27 ENCOUNTER — Ambulatory Visit: Payer: Medicare Other | Attending: Family Medicine | Admitting: Physical Therapy

## 2019-09-27 ENCOUNTER — Other Ambulatory Visit: Payer: Self-pay

## 2019-09-27 ENCOUNTER — Encounter: Payer: Self-pay | Admitting: Physical Therapy

## 2019-09-27 DIAGNOSIS — M25562 Pain in left knee: Secondary | ICD-10-CM | POA: Diagnosis present

## 2019-09-27 DIAGNOSIS — M25561 Pain in right knee: Secondary | ICD-10-CM | POA: Diagnosis present

## 2019-09-27 DIAGNOSIS — M6281 Muscle weakness (generalized): Secondary | ICD-10-CM | POA: Insufficient documentation

## 2019-09-27 NOTE — Therapy (Signed)
[facility] [address] [place], [place], [address] Phone: [phone]   Fax:  [phone]  Physical Therapy Evaluation  Patient Details  Name: April Jones MRN: [id number] Date of Birth: 08/05/1946 Referring Provider (PT): Dibas Dorthy Cooler, MD    Encounter Date: 09/27/2019  PT End of Session - 09/27/19 1140    Visit Number  1    Date for PT Re-Evaluation  11/10/19    Authorization Type  UHC Medicare    Authorization Time Period  09/27/19 to 11/10/19    Authorization - Visit Number  1    Authorization - Number of Visits  10    PT Start Time  7017    PT Stop Time  1215    PT Time Calculation (min)  40 min    Activity Tolerance  Patient tolerated treatment well    Behavior During Therapy  [facility] for tasks assessed/performed       History reviewed. No pertinent past medical history.  Past Surgical History:  Procedure Laterality Date  . BREAST BIOPSY Right 1978  . BREAST EXCISIONAL BIOPSY Right     There were no vitals filed for this visit.   Subjective Assessment - 09/27/19 1137    Subjective  Pt states that her knees have been bothering her more since she stopped going to the gym back in March.    Limitations  Walking    Patient Stated Goals  decrease pain with activity    Currently in Pain?  No/denies    Aggravating Factors   stairs, using the curbs, sitting too long    Pain Relieving Factors  avoiding aggravating factors         [facility] PT Assessment - 09/27/19 0001      Assessment   Medical Diagnosis  B knee osteoarthritis    Referring Provider (PT)  Dibas Koirala, MD     Onset Date/Surgical Date  --   March 2020   Prior Therapy  none       Precautions   Precautions  None      Restrictions   Weight Bearing Restrictions  No      Balance Screen   Has the patient fallen in the past 6 months  No    Has the patient had a decrease in activity level because of a fear of falling?   No    Is  the patient reluctant to leave their home because of a fear of falling?   No      Home Environment   Additional Comments  no steps at home, a small incline to get in      Prior Function   Level of Independence  Independent      Cognition   Overall Cognitive Status  Within Functional Limits for tasks assessed      Observation/Other Assessments   Observations  pt rubbing her knees during session      ROM / Strength   AROM / PROM / Strength  AROM;Strength      AROM   Overall AROM Comments  Rt knee: lacking 30 deg, 95 deg flexion; Lt: 5 deg lacking extension, 120 deg flexion      Strength   Strength Assessment Site  Hip;Knee    Right/Left Hip  Right;Left    Right Hip Flexion  4/5    Right Hip Extension  3/5    Right Hip ABduction  3+/5    Left Hip Flexion  4/5  Left Hip Extension  3/5    Left Hip ABduction  3+/5    Right/Left Knee  Right;Left    Right Knee Flexion  4/5    Right Knee Extension  5/5    Left Knee Flexion  4/5    Left Knee Extension  5/5      Flexibility   Soft Tissue Assessment /Muscle Length  yes    Quadriceps  Rt: 80 deg, Lt: 90 deg       Transfers   Five time sit to stand comments   11 sec, weight shifted Lt and knees remained flexed at full stand      Ambulation/Gait   Gait Comments  pt ambulating with SPC, decreased hip extension, knee flexion maintained throughout gait       Standardized Balance Assessment   Standardized Balance Assessment  Timed Up and Go Test      Timed Up and Go Test   TUG Comments  16 sec with SPC                Objective measurements completed on examination: See above findings.      [facility] Adult PT Treatment/Exercise - 09/27/19 0001      Exercises   Exercises  Knee/Hip      Knee/Hip Exercises: Stretches   Quad Stretch  Both;1 rep;20 seconds    Quad Stretch Limitations  standing LE in chair     Other Knee/Hip Stretches  seated Rt and Lt knee extension stretch with overpressure HEP demo              PT Education - 09/27/19 1224    Education Details  eval findings/ POC; implemented HEP    Person(s) Educated  Patient    Methods  Explanation;Handout    Comprehension  Verbalized understanding       PT Short Term Goals - 09/27/19 1218      PT SHORT TERM GOAL #1   Title  Pt will demo consistency and independence with her initial HEP to increase ROM, strength and mobility.    Time  3    Period  Weeks    Status  New    Target Date  10/18/19        PT Long Term Goals - 09/27/19 1219      PT LONG TERM GOAL #1   Title  Pt will demo improved Rt knee ROM to lacking no more than 15 deg knee extension and atleast 100 deg of knee flexion which will increase her mobility.    Time  6    Period  Weeks    Status  New    Target Date  11/10/19      PT LONG TERM GOAL #2   Title  Pt will demo improved BLE strength to atleast 4/5 MMT which will increase her efficiency with activity.    Time  6    Period  Weeks    Status  New      PT LONG TERM GOAL #3   Title  Pt will be able to complete TUG in less than 14 sec with LRAD which will reflect improvements in balance and stability throughout the community.    Time  6    Period  Weeks    Status  New      PT LONG TERM GOAL #4   Title  Pt will report atleast 50% improvement in her knee pain from the start of PT with daily activity.  Time  6    Period  Weeks    Status  New             Plan - 09/27/19 1212    Clinical Impression Statement  Pt is a pleasant 73 y.o F referred to OPPT with increasing knee pain and stiffness since having to stop going to her local gym in March. Pt presents with limitations in Rt knee ROM greater than on the Lt, lacking close to 30 deg of knee extension. In addition, there is bilateral LE weakness primarily in the hips. Pt also demonstrates slowed gait, now using SPC, with decreased hip extension and knee extension bilaterally. Pt would benefit from skilled PT to address her pain and  limitations in knee ROM, LE strength and proprioception for improved independence with daily activity.    Personal Factors and Comorbidities  Age    Examination-Activity Limitations  Squat;Stairs;Locomotion Level    Examination-Participation Restrictions  Other    Stability/Clinical Decision Making  Stable/Uncomplicated    Clinical Decision Making  Low    Rehab Potential  Good    PT Frequency  2x / week    PT Duration  6 weeks    PT Treatment/Interventions  ADLs/Self Care Home Management;Aquatic Therapy;Cryotherapy;Gait training;Functional mobility training;Stair training;Therapeutic activities;Therapeutic exercise;Neuromuscular re-education;Manual techniques;Passive range of motion;Patient/family education;Joint Manipulations    PT Next Visit Plan  knee ROM progressions; hip strengthening; proprioception activity    PT Home Exercise Plan  [email or web address]    Recommended Other Services  need FOTO next visit    Consulted and Agree with Plan of Care  Patient       Patient will benefit from skilled therapeutic intervention in order to improve the following deficits and impairments:  Abnormal gait, Decreased balance, Decreased range of motion, Decreased strength, Pain, Improper body mechanics, Impaired flexibility, Hypomobility, Difficulty walking  Visit Diagnosis: Right knee pain, unspecified chronicity - Plan: PT plan of care cert/re-cert  Left knee pain, unspecified chronicity - Plan: PT plan of care cert/re-cert  Muscle weakness (generalized) - Plan: PT plan of care cert/re-cert     Problem List There are no active problems to display for this patient.   12:27 PM,09/27/19 Donita Brooks PT, DPT [facility]  [phone]  [facility] [address] [place], [place], [address] Phone: [phone]   Fax:  [phone]  Name: April Jones MRN: [id number] Date of Birth: May 20, 1946

## 2019-09-27 NOTE — Patient Instructions (Signed)
Access Code: [email or web address]  URL: [email or web address]  Date: 09/27/2019  Prepared by: Sherol Dade   Exercises  Seated Quad Set - 3 reps - 20 hold - 3x daily - 7x weekly  Quadricep Stretch with Chair and Counter Support - 3 reps - 20 hold - 3x daily - 7x weekly    [facility] [address] [place], [place] [address] Phone # [phone] Fax [phone]

## 2019-10-04 ENCOUNTER — Encounter: Payer: Self-pay | Admitting: Physical Therapy

## 2019-10-04 ENCOUNTER — Ambulatory Visit: Payer: Medicare Other | Admitting: Physical Therapy

## 2019-10-04 ENCOUNTER — Other Ambulatory Visit: Payer: Self-pay

## 2019-10-04 DIAGNOSIS — M25562 Pain in left knee: Secondary | ICD-10-CM

## 2019-10-04 DIAGNOSIS — M25561 Pain in right knee: Secondary | ICD-10-CM | POA: Diagnosis not present

## 2019-10-04 DIAGNOSIS — M6281 Muscle weakness (generalized): Secondary | ICD-10-CM

## 2019-10-04 NOTE — Therapy (Signed)
[facility] [address], [address] [place], [place], [address] Phone: [phone]   Fax:  [phone]  Physical Therapy Treatment  Patient Details  Name: April Jones MRN: [id number] Date of Birth: 1946-02-13 Referring Provider (PT): Dibas Docia Chuck, MD    Encounter Date: 10/04/2019  PT End of Session - 10/04/19 1233    Visit Number  2    Date for PT Re-Evaluation  11/10/19    Authorization Type  UHC Medicare    Authorization Time Period  09/27/19 to 11/10/19    Authorization - Visit Number  1    Authorization - Number of Visits  10    PT Start Time  1145    PT Stop Time  1230    PT Time Calculation (min)  45 min    Activity Tolerance  Patient tolerated treatment well;No increased pain    Behavior During Therapy  [facility] for tasks assessed/performed       History reviewed. No pertinent past medical history.  Past Surgical History:  Procedure Laterality Date  . BREAST BIOPSY Right 1978  . BREAST EXCISIONAL BIOPSY Right     There were no vitals filed for this visit.  Subjective Assessment - 10/04/19 1149    Subjective  Pt states things are going well. She has been working on her HEP. No pain currently.    Limitations  Walking    Patient Stated Goals  decrease pain with activity    Currently in Pain?  No/denies                       [facility] Adult PT Treatment/Exercise - 10/04/19 0001      Knee/Hip Exercises: Aerobic   Nustep  Seat 8: L2/L4 intervals every 2 min, x8 min PT present to complete FOTO assessment       Knee/Hip Exercises: Seated   Long Arc Quad  Both;2 sets;10 reps;Weights    Long Arc Quad Weight  2 lbs.    Hamstring Curl  Both;Strengthening;1 set;10 reps    Hamstring Limitations  yellow TB       Knee/Hip Exercises: Supine   Quad Sets  Both;1 set;10 reps    Heel Slides  AAROM;Both;1 set;10 reps    Heel Slides Limitations  5 sec hold       Knee/Hip Exercises: Sidelying   Clams   yellow TB 2x10 reps each       Manual Therapy   Manual Therapy  Joint mobilization    Joint Mobilization  Rt and Lt knee extension mobilization with tibial external rotation x10 reps; seated posterior medial tibial rotation mobilization             PT Education - 10/04/19 1222    Education Details  updates to HEP    Person(s) Educated  Patient    Methods  Explanation;Handout    Comprehension  Verbalized understanding;Returned demonstration       PT Short Term Goals - 09/27/19 1218      PT SHORT TERM GOAL #1   Title  Pt will demo consistency and independence with her initial HEP to increase ROM, strength and mobility.    Time  3    Period  Weeks    Status  New    Target Date  10/18/19        PT Long Term Goals - 09/27/19 1219      PT LONG TERM GOAL #1   Title  Pt will demo  improved Rt knee ROM to lacking no more than 15 deg knee extension and atleast 100 deg of knee flexion which will increase her mobility.    Time  6    Period  Weeks    Status  New    Target Date  11/10/19      PT LONG TERM GOAL #2   Title  Pt will demo improved BLE strength to atleast 4/5 MMT which will increase her efficiency with activity.    Time  6    Period  Weeks    Status  New      PT LONG TERM GOAL #3   Title  Pt will be able to complete TUG in less than 14 sec with LRAD which will reflect improvements in balance and stability throughout the community.    Time  6    Period  Weeks    Status  New      PT LONG TERM GOAL #4   Title  Pt will report atleast 50% improvement in her knee pain from the start of PT with daily activity.    Time  6    Period  Weeks    Status  New            Plan - 10/04/19 1233    Clinical Impression Statement  Pt arrived without complaints of knee pain and ambulating without her SPC. Session focused on increasing knee and hip ROM. PT had to make some modifications to pt's supine therex due to UE weakness. Pt did well with tibial mobilization, noting  no increase in pain, although there was initial stiffness immediately following each technique. Introduced hip strengthening exercise and pt had no issues with this. HEP was updated to reflect progressions during today's session.    Personal Factors and Comorbidities  Age    Examination-Activity Limitations  Squat;Stairs;Locomotion Level    Examination-Participation Restrictions  Other    Stability/Clinical Decision Making  Stable/Uncomplicated    Rehab Potential  Good    PT Frequency  2x / week    PT Duration  6 weeks    PT Treatment/Interventions  ADLs/Self Care Home Management;Aquatic Therapy;Cryotherapy;Gait training;Functional mobility training;Stair training;Therapeutic activities;Therapeutic exercise;Neuromuscular re-education;Manual techniques;Passive range of motion;Patient/family education;Joint Manipulations    PT Next Visit Plan  knee ROM progressions; patellar and knee mobilizations; hip strengthening; proprioception activity    PT Home Exercise Plan  [email or web address]    Consulted and Agree with Plan of Care  Patient       Patient will benefit from skilled therapeutic intervention in order to improve the following deficits and impairments:  Abnormal gait, Decreased balance, Decreased range of motion, Decreased strength, Pain, Improper body mechanics, Impaired flexibility, Hypomobility, Difficulty walking  Visit Diagnosis: Right knee pain, unspecified chronicity  Left knee pain, unspecified chronicity  Muscle weakness (generalized)     Problem List There are no active problems to display for this patient.  12:36 PM,10/04/19 Sherol Dade PT, DPT [facility] [address], [place], [place], [address] Phone: [phone]   Fax:  [phone]  Name: April Jones MRN: [id number] Date of Birth: [birth year]

## 2019-10-04 NOTE — Patient Instructions (Signed)
Access Code: [email or web address]  URL: [email or web address]  Date: 10/04/2019  Prepared by: Sherol Dade   Exercises  Seated Quad Set - 3 reps - 20 hold - 3x daily - 7x weekly  Quadricep Stretch with Chair and Counter Support - 3 reps - 20 hold - 3x daily - 7x weekly  Clamshell with Resistance - 10 reps - 2 sets - 1x daily - 7x weekly    [facility] [address], [place], [place] [address] Phone # [phone] Fax [phone]

## 2019-10-06 ENCOUNTER — Ambulatory Visit: Payer: Medicare Other | Admitting: Physical Therapy

## 2019-10-06 ENCOUNTER — Other Ambulatory Visit: Payer: Self-pay

## 2019-10-06 ENCOUNTER — Encounter: Payer: Self-pay | Admitting: Physical Therapy

## 2019-10-06 DIAGNOSIS — M6281 Muscle weakness (generalized): Secondary | ICD-10-CM

## 2019-10-06 DIAGNOSIS — M25562 Pain in left knee: Secondary | ICD-10-CM

## 2019-10-06 DIAGNOSIS — M25561 Pain in right knee: Secondary | ICD-10-CM

## 2019-10-06 NOTE — Patient Instructions (Signed)
Access Code: [email or web address]  URL: [email or web address]  Date: 10/06/2019  Prepared by: Venetia Night Beuhring   Exercises  Seated Quad Set - 3 reps - 20 hold - 3x daily - 7x weekly  Quadricep Stretch with Chair and Counter Support - 3 reps - 20 hold - 3x daily - 7x weekly  Clamshell with Resistance - 10 reps - 2 sets - 1x daily - 7x weekly  Seated Hip Abduction with Resistance - 10 reps - 2 sets - 1x daily - 7x weekly  Seated March with Resistance - 20 reps - 2 sets - 1x daily - 7x weekly  Seated Heel Toe Raises - 20 reps - 2 sets - 1x daily - 7x weekly  Sit to Stand with Arms Crossed - 5 reps - 2 sets - 1x daily - 7x weekly

## 2019-10-06 NOTE — Therapy (Signed)
[facility] [address], [address] [place], [place], [address] Phone: [phone]   Fax:  [phone]  Physical Therapy Treatment  Patient Details  Name: April LeventhalFaye Nichols Matters MRN: [id number] Date of Birth: 04/30/1946 Referring Provider (PT): Dibas Docia ChuckKoirala, MD    Encounter Date: 10/06/2019  PT End of Session - 10/06/19 1046    Visit Number  3    Date for PT Re-Evaluation  11/10/19    Authorization Type  UHC Medicare    Authorization Time Period  09/27/19 to 11/10/19    PT Start Time  1010    PT Stop Time  1100    PT Time Calculation (min)  50 min    Activity Tolerance  Patient tolerated treatment well;No increased pain    Behavior During Therapy  [facility]       History reviewed. No pertinent past medical history.  Past Surgical History:  Procedure Laterality Date  . BREAST BIOPSY Right 1978  . BREAST EXCISIONAL BIOPSY Right     There were no vitals filed for this visit.  Subjective Assessment - 10/06/19 1013    Subjective  PT is helping, I'm doing much better. I keep forgetting my cane so I must be getting better if I don't need it.    Limitations  Walking    Patient Stated Goals  decrease pain with activity    Currently in Pain?  Yes    Pain Score  2     Pain Location  Knee    Pain Orientation  Right;Left;Anterior;Medial    Pain Descriptors / Indicators  Aching    Pain Type  Chronic pain    Pain Onset  More than a month ago    Pain Frequency  Constant    Aggravating Factors   stairs, sitting too long                       [facility] Adult PT Treatment/Exercise - 10/06/19 0001      Exercises   Exercises  Ankle      Knee/Hip Exercises: Aerobic   Nustep  Seat 8: L2/L4 intervals every 2 min, x8 min PT present to complete FOTO assessment       Knee/Hip Exercises: Standing   Lateral Step Up  Both;10 reps;Hand Hold: 2;Step Height: 2"    Lateral Step Up Limitations  pain on Rt  but Pt wanted to continue    Forward Step Up  Both;10 reps;Hand Hold: 1;Step Height: 2"    Forward Step Up Limitations  PT cued press through heel for glute activation    Step Down  Both;10 reps;Hand Hold: 1;Step Height: 2"    Step Down Limitations  PT cued heel toe vs toe strike      Knee/Hip Exercises: Seated   Long Arc Quad  Both;2 sets;10 reps;Weights    Long Arc Quad Weight  2 lbs.    Heel Slides  Right;Left;10 reps    Heel Slides Limitations  foot on slider    Marching  Strengthening;Both;20 reps    Marching Limitations  yellow band around thighs    Hamstring Curl  Both;Strengthening;10 reps;2 sets    Hamstring Limitations  yellow TB     Abduction/Adduction   Strengthening;Both;10 reps    Abd/Adduction Limitations  yellow clam x 10 yellow and ball squeeze 5x5 sec each    Sit to Sand  2 sets;5 reps;without UE support   PT cued pause in standing to  focus on balance/control     Manual Therapy   Manual Therapy  Joint mobilization    Joint Mobilization  end range tibial rotation Gr II/III on Rt for end range ext and flexion on Rt in sitting      Ankle Exercises: Seated   Toe Raise Limitations  with heel raises x 20             PT Education - 10/06/19 1046    Education Details  Access Code: [email or web address]    Person(s) Educated  Patient    Methods  Explanation;Demonstration;Handout;Verbal cues    Comprehension  Verbalized understanding;Returned demonstration       PT Short Term Goals - 10/06/19 1051      PT SHORT TERM GOAL #1   Title  Pt will demo consistency and independence with her initial HEP to increase ROM, strength and mobility.    Status  On-going        PT Long Term Goals - 09/27/19 1219      PT LONG TERM GOAL #1   Title  Pt will demo improved Rt knee ROM to lacking no more than 15 deg knee extension and atleast 100 deg of knee flexion which will increase her mobility.    Time  6    Period  Weeks    Status  New    Target Date  11/10/19      PT LONG TERM  GOAL #2   Title  Pt will demo improved BLE strength to atleast 4/5 MMT which will increase her efficiency with activity.    Time  6    Period  Weeks    Status  New      PT LONG TERM GOAL #3   Title  Pt will be able to complete TUG in less than 14 sec with LRAD which will reflect improvements in balance and stability throughout the community.    Time  6    Period  Weeks    Status  New      PT LONG TERM GOAL #4   Title  Pt will report atleast 50% improvement in her knee pain from the start of PT with daily activity.    Time  6    Period  Weeks    Status  New            Plan - 10/06/19 1052    Clinical Impression Statement  Pt was able to complete standing and seated ther ex progression without increased pain today.  PT cued heel strike on step downs from 2" riser vs forefoot.  PT also cued Pt to pause to gain balance control with each rep of sit to stand today.  Rt knee continues to lack full extension and flexion more so compared to Lt.  Pt compliant with initial HEP.  Continue current POC with careful monitoring of pain behavior with progression.    Rehab Potential  Good    PT Frequency  2x / week    PT Duration  6 weeks    PT Treatment/Interventions  ADLs/Self Care Home Management;Aquatic Therapy;Cryotherapy;Gait training;Functional mobility training;Stair training;Therapeutic activities;Therapeutic exercise;Neuromuscular re-education;Manual techniques;Passive range of motion;Patient/family education;Joint Manipulations    PT Next Visit Plan  knee ROM progressions; patellar and knee mobilizations; hip strengthening; proprioception activity    PT Home Exercise Plan  [email or web address]    Recommended Other Services  f/u on updated HEP    Consulted and Agree with Plan of Care  Patient  Patient will benefit from skilled therapeutic intervention in order to improve the following deficits and impairments:  Abnormal gait, Decreased balance, Decreased range of motion, Decreased strength,  Pain, Improper body mechanics, Impaired flexibility, Hypomobility, Difficulty walking  Visit Diagnosis: Right knee pain, unspecified chronicity  Left knee pain, unspecified chronicity  Muscle weakness (generalized)     Problem List There are no active problems to display for this patient.   Venetia Night Daviona Herbert, PT 10/06/19 10:55 AM   [facility] [address] [place], [place], [address] Phone: [phone]   Fax:  [phone]  Name: Paisli Silfies MRN: [id number] Date of Birth: Feb 02, 1946

## 2019-10-11 ENCOUNTER — Encounter: Payer: Self-pay | Admitting: Physical Therapy

## 2019-10-11 ENCOUNTER — Other Ambulatory Visit: Payer: Self-pay

## 2019-10-11 ENCOUNTER — Ambulatory Visit: Payer: Medicare Other | Admitting: Physical Therapy

## 2019-10-11 DIAGNOSIS — M25562 Pain in left knee: Secondary | ICD-10-CM

## 2019-10-11 DIAGNOSIS — M25561 Pain in right knee: Secondary | ICD-10-CM | POA: Diagnosis not present

## 2019-10-11 DIAGNOSIS — M6281 Muscle weakness (generalized): Secondary | ICD-10-CM

## 2019-10-11 NOTE — Therapy (Signed)
[facility] [address] [place], [place], [address] Phone: [phone]   Fax:  [phone]  Physical Therapy Treatment  Patient Details  Name: April Jones MRN: [id number] Date of Birth: 10/05/1946 Referring Provider (PT): Dibas Dorthy Cooler, MD    Encounter Date: 10/11/2019  PT End of Session - 10/11/19 1325    Visit Number  4    Date for PT Re-Evaluation  11/10/19    Authorization Type  UHC Medicare    Authorization Time Period  09/27/19 to 11/10/19    PT Start Time  1322    PT Stop Time  1400    PT Time Calculation (min)  38 min    Activity Tolerance  Patient tolerated treatment well;No increased pain    Behavior During Therapy  [facility] for tasks assessed/performed       History reviewed. No pertinent past medical history.  Past Surgical History:  Procedure Laterality Date  . BREAST BIOPSY Right 1978  . BREAST EXCISIONAL BIOPSY Right     There were no vitals filed for this visit.  Subjective Assessment - 10/11/19 1327    Subjective  Pt states knee is feeling better and she is doing her HEP consistently. Pt denies pain.    Patient Stated Goals  decrease pain with activity    Currently in Pain?  No/denies                       [facility] Adult PT Treatment/Exercise - 10/11/19 0001      Knee/Hip Exercises: Aerobic   Nustep  Seat 8: L2/L4 intervals every min, x8 min PT present get status update      Knee/Hip Exercises: Standing   Lateral Step Up  Both;10 reps;Hand Hold: 2;Step Height: 4"    Lateral Step Up Limitations  no pain    Forward Step Up  Both;10 reps;Hand Hold: 1;Step Height: 4"    Forward Step Up Limitations  PT cued press through heel for glute activation      Knee/Hip Exercises: Seated   Long Arc Quad  Both;2 sets;10 reps;Weights    Long Arc Quad Weight  2 lbs.    Marching  Strengthening;Both;20 reps    Marching Weights  2 lbs.    Hamstring Curl  Both;Strengthening;10 reps;2  sets    Abduction/Adduction   Strengthening;Both;10 reps    Abd/Adduction Limitations  yellow clam x 10 yellow and ball squeeze 5x5 sec each    Sit to Sand  2 sets;5 reps;without UE support   PT cued pause in standing to focus on balance/control     Knee/Hip Exercises: Supine   Quad Sets  Both;1 set;10 reps    Heel Slides  AAROM;Both;1 set;10 reps    Heel Slides Limitations  5 sec hold       Manual Therapy   Manual Therapy  Joint mobilization    Joint Mobilization  end range tibial rotation Gr II/III on Rt P/A in supine - supine with 2 pillow under hips               PT Short Term Goals - 10/11/19 1326      PT SHORT TERM GOAL #1   Title  Pt will demo consistency and independence with her initial HEP to increase ROM, strength and mobility.    Status  Achieved        PT Long Term Goals - 09/27/19 1219      PT LONG TERM GOAL #  1   Title  Pt will demo improved Rt knee ROM to lacking no more than 15 deg knee extension and atleast 100 deg of knee flexion which will increase her mobility.    Time  6    Period  Weeks    Status  New    Target Date  11/10/19      PT LONG TERM GOAL #2   Title  Pt will demo improved BLE strength to atleast 4/5 MMT which will increase her efficiency with activity.    Time  6    Period  Weeks    Status  New      PT LONG TERM GOAL #3   Title  Pt will be able to complete TUG in less than 14 sec with LRAD which will reflect improvements in balance and stability throughout the community.    Time  6    Period  Weeks    Status  New      PT LONG TERM GOAL #4   Title  Pt will report atleast 50% improvement in her knee pain from the start of PT with daily activity.    Time  6    Period  Weeks    Status  New            Plan - 10/11/19 1450    Clinical Impression Statement  Pt was able to increase step height and resistance in seated exercises today.  She was monitored for pain throughout and reports no increased pain today.  Pt is cued to  reach forward during sit to stand and able to do without pushing on thighs.  She needed some cues to lean forward as well . Pt will continue to benefi tfrom skilled PT to reach functional goals according to POC.    PT Treatment/Interventions  ADLs/Self Care Home Management;Aquatic Therapy;Cryotherapy;Gait training;Functional mobility training;Stair training;Therapeutic activities;Therapeutic exercise;Neuromuscular re-education;Manual techniques;Passive range of motion;Patient/family education;Joint Manipulations    PT Next Visit Plan  knee ROM progressions; patellar and knee mobilizations; hip strengthening; proprioception activity    PT Home Exercise Plan  [email or web address]    Consulted and Agree with Plan of Care  Patient       Patient will benefit from skilled therapeutic intervention in order to improve the following deficits and impairments:  Abnormal gait, Decreased balance, Decreased range of motion, Decreased strength, Pain, Improper body mechanics, Impaired flexibility, Hypomobility, Difficulty walking  Visit Diagnosis: Left knee pain, unspecified chronicity  Right knee pain, unspecified chronicity  Muscle weakness (generalized)     Problem List There are no active problems to display for this patient.   Junious Silk, PT 10/11/2019, 2:53 PM  [facility] [address], [address] [place], [place], [address] Phone: [phone]   Fax:  [phone]  Name: April Jones MRN: [id number] Date of Birth: [birth year]

## 2019-10-13 ENCOUNTER — Encounter: Payer: Self-pay | Admitting: Physical Therapy

## 2019-10-13 ENCOUNTER — Ambulatory Visit: Payer: Medicare Other | Admitting: Physical Therapy

## 2019-10-13 ENCOUNTER — Other Ambulatory Visit: Payer: Self-pay

## 2019-10-13 DIAGNOSIS — M6281 Muscle weakness (generalized): Secondary | ICD-10-CM

## 2019-10-13 DIAGNOSIS — M25562 Pain in left knee: Secondary | ICD-10-CM

## 2019-10-13 DIAGNOSIS — M25561 Pain in right knee: Secondary | ICD-10-CM

## 2019-10-13 NOTE — Therapy (Signed)
[facility] [address], [place], [place], [address] Phone: [phone]   Fax:  [phone]  Physical Therapy Treatment  Patient Details  Name: April Jones MRN: [id number] Date of Birth: [birth year] Referring Provider (PT): Dibas Dorthy Cooler, MD    Encounter Date: 10/13/2019  PT End of Session - 10/13/19 1022    Visit Number  5    Date for PT Re-Evaluation  11/10/19    Authorization Type  UHC Medicare    Authorization Time Period  09/27/19 to 11/10/19    PT Start Time  1015    PT Stop Time  1050   short session due to Pt fatigue   PT Time Calculation (min)  35 min    Activity Tolerance  Patient tolerated treatment well;No increased pain    Behavior During Therapy  [facility] for tasks assessed/performed       History reviewed. No pertinent past medical history.  Past Surgical History:  Procedure Laterality Date  . BREAST BIOPSY Right 1978  . BREAST EXCISIONAL BIOPSY Right     There were no vitals filed for this visit.  Subjective Assessment - 10/13/19 1022    Subjective  Knees are doing well. I didn't need my cane today.  I only need it sometimes.  No pain now b/c I just got up    Limitations  Walking    Currently in Pain?  No/denies                       [facility] Adult PT Treatment/Exercise - 10/13/19 0001      Knee/Hip Exercises: Aerobic   Nustep  Seat 8 L2/L4 alt every 2' x 10'      Knee/Hip Exercises: Supine   Short Arc Quad Sets  Strengthening;Both;10 reps    Short Arc Quad Sets Limitations  hold 5 sec each    Heel Slides  AROM;Both;1 set;10 reps    Bridges  Strengthening;Both;10 reps    Bridges Limitations  with yellow band to cue gentle hip abd co-contraction    Straight Leg Raises  Strengthening;Both;2 sets;10 reps    Straight Leg Raises Limitations  added to HEP    Other Supine Knee/Hip Exercises  yellow small range hip abd clam with glute squeeze to prep for bridges 10x5 sec       Knee/Hip Exercises: Sidelying   Clams  yellow band x 10 bil             PT Education - 10/13/19 1043    Education Details  Access Code: [email or web address]    Person(s) Educated  Patient    Methods  Explanation    Comprehension  Verbalized understanding;Returned demonstration       PT Short Term Goals - 10/11/19 1326      PT SHORT TERM GOAL #1   Title  Pt will demo consistency and independence with her initial HEP to increase ROM, strength and mobility.    Status  Achieved        PT Long Term Goals - 09/27/19 1219      PT LONG TERM GOAL #1   Title  Pt will demo improved Rt knee ROM to lacking no more than 15 deg knee extension and atleast 100 deg of knee flexion which will increase her mobility.    Time  6    Period  Weeks    Status  New    Target Date  11/10/19      PT  LONG TERM GOAL #2   Title  Pt will demo improved BLE strength to atleast 4/5 MMT which will increase her efficiency with activity.    Time  6    Period  Weeks    Status  New      PT LONG TERM GOAL #3   Title  Pt will be able to complete TUG in less than 14 sec with LRAD which will reflect improvements in balance and stability throughout the community.    Time  6    Period  Weeks    Status  New      PT LONG TERM GOAL #4   Title  Pt will report atleast 50% improvement in her knee pain from the start of PT with daily activity.    Time  6    Period  Weeks    Status  New            Plan - 10/13/19 1040    Clinical Impression Statement  Pt reports feeling 80% better since starting PT.  She only uses cane intermittently and enjoys the exercises.  She continues to have limited Rt knee extension but adapts around it and reports improved mobility with compliance of HEP.  PT added bridges and SLRs to HEP today. Pt increased NuStep time to 10 min for improved endurance.  Continue along POC for ROM, strength, endurance of bil LEs.  Pt felt good end of session but did request to stop due to feeling "good  fatigue" so slightly short session.    Rehab Potential  Good    PT Frequency  2x / week    PT Duration  6 weeks    PT Treatment/Interventions  ADLs/Self Care Home Management;Aquatic Therapy;Cryotherapy;Gait training;Functional mobility training;Stair training;Therapeutic activities;Therapeutic exercise;Neuromuscular re-education;Manual techniques;Passive range of motion;Patient/family education;Joint Manipulations    PT Next Visit Plan  f/u on SLR and bridge added last visit, continue NuStep, seated, supine, sidelying and standing ther ex as tol    PT Home Exercise Plan  [email or web address]    Consulted and Agree with Plan of Care  Patient       Patient will benefit from skilled therapeutic intervention in order to improve the following deficits and impairments:     Visit Diagnosis: Left knee pain, unspecified chronicity  Right knee pain, unspecified chronicity  Muscle weakness (generalized)     Problem List There are no active problems to display for this patient.   Loistine Simas Beuhring, PT 10/13/19 10:50 AM   [facility] [address] [place], [place], [address] Phone: [phone]   Fax:  [phone]  Name: Ashwini Jago MRN: [id number] Date of Birth: [birth year]

## 2019-10-13 NOTE — Patient Instructions (Addendum)
Access Code: [email or web address]  URL: [email or web address]  Date: 10/13/2019  Prepared by: Venetia Night Judah Carchi   Exercises  Seated Quad Set - 3 reps - 20 hold - 3x daily - 7x weekly  Quadricep Stretch with Chair and Counter Support - 3 reps - 20 hold - 3x daily - 7x weekly  Clamshell with Resistance - 10 reps - 2 sets - 1x daily - 7x weekly  Seated Hip Abduction with Resistance - 10 reps - 2 sets - 1x daily - 7x weekly  Seated March with Resistance - 20 reps - 2 sets - 1x daily - 7x weekly  Seated Heel Toe Raises - 20 reps - 2 sets - 1x daily - 7x weekly  Sit to Stand with Arms Crossed - 5 reps - 2 sets - 1x daily - 7x weekly  Straight Leg Raise - 10 reps - 2 sets - 1x daily - 7x weekly  Constance Haw

## 2019-10-18 ENCOUNTER — Other Ambulatory Visit: Payer: Self-pay

## 2019-10-18 ENCOUNTER — Ambulatory Visit: Payer: Medicare Other | Attending: Family Medicine | Admitting: Physical Therapy

## 2019-10-18 ENCOUNTER — Encounter: Payer: Self-pay | Admitting: Physical Therapy

## 2019-10-18 DIAGNOSIS — M6281 Muscle weakness (generalized): Secondary | ICD-10-CM | POA: Diagnosis present

## 2019-10-18 DIAGNOSIS — M25561 Pain in right knee: Secondary | ICD-10-CM | POA: Diagnosis present

## 2019-10-18 DIAGNOSIS — M25562 Pain in left knee: Secondary | ICD-10-CM | POA: Diagnosis not present

## 2019-10-18 NOTE — Therapy (Signed)
[facility] [address], [place], [place], [address] Phone: [phone]   Fax:  [phone]  Physical Therapy Treatment  Patient Details  Name: April Jones MRN: [id number] Date of Birth: 1945/12/15 Referring Provider (PT): Dibas Dorthy Cooler, MD    Encounter Date: 10/18/2019  PT End of Session - 10/18/19 1326    Visit Number  6    Date for PT Re-Evaluation  11/10/19    Authorization Type  UHC Medicare    Authorization Time Period  09/27/19 to 11/10/19    PT Start Time  1145    PT Stop Time  1225    PT Time Calculation (min)  40 min    Activity Tolerance  Patient tolerated treatment well;No increased pain    Behavior During Therapy  [facility] for tasks assessed/performed       History reviewed. No pertinent past medical history.  Past Surgical History:  Procedure Laterality Date  . BREAST BIOPSY Right 1978  . BREAST EXCISIONAL BIOPSY Right     There were no vitals filed for this visit.  Subjective Assessment - 10/18/19 1154    Subjective  Pt states that things are going great. She is not using her SPC as much anymore. She has no pain currently.    Limitations  Walking    Currently in Pain?  No/denies                       [facility] Adult PT Treatment/Exercise - 10/18/19 0001      Knee/Hip Exercises: Aerobic   Nustep  Seat 8: L2 x2 min, increased to L4 x60 sec intermittently, x8 min      Knee/Hip Exercises: Standing   Other Standing Knee Exercises  sidestep and back with red TB around knees x10 reps       Knee/Hip Exercises: Supine   Short Arc Quad Sets  Strengthening;Both;10 reps    Short Arc Quad Sets Limitations  5 sec, over foam roll     Other Supine Knee/Hip Exercises  B hamstring curl isometric 90/90 position with heels on red physioball 10x5 sec       Knee/Hip Exercises: Sidelying   Clams  attempted red TB x10 reps each side, pt able to complete through good range for 5 reps        Manual Therapy   Joint Mobilization  Rt knee extension mobilization with tibial external rotation grade III-IV, Rt knee flexion posterior rotational mobilization in seated grade III-IV x2 bouts              PT Education - 10/18/19 1329    Education Details  updates to [employer]) Educated  Patient    Methods  Explanation;Verbal cues    Comprehension  Returned demonstration;Verbalized understanding       PT Short Term Goals - 10/18/19 1200      PT SHORT TERM GOAL #1   Title  Pt will demo consistency and independence with her initial HEP to increase ROM, strength and mobility.    Status  Achieved        PT Long Term Goals - 10/18/19 1201      PT LONG TERM GOAL #1   Title  Pt will demo improved Rt knee ROM to lacking no more than 15 deg knee extension and atleast 100 deg of knee flexion which will increase her mobility.    Baseline  -25 deg extension, 100 deg flexion  Time  6    Period  Weeks    Status  Partially Met      PT LONG TERM GOAL #2   Title  Pt will demo improved BLE strength to atleast 4/5 MMT which will increase her efficiency with activity.    Time  6    Period  Weeks    Status  New      PT LONG TERM GOAL #3   Title  Pt will be able to complete TUG in less than 14 sec with LRAD which will reflect improvements in balance and stability throughout the community.    Time  6    Period  Weeks    Status  New      PT LONG TERM GOAL #4   Title  Pt will report atleast 50% improvement in her knee pain from the start of PT with daily activity.    Time  6    Period  Weeks    Status  New            Plan - 10/18/19 1326    Clinical Impression Statement  Pt is making good progress towards her goals. She has greater than 100 deg of Rt knee flexion measured during today's session and has noted improvements in her knee pain and flexibility throughout the day. She feels greater than 70% improved with her daily activity. Pt was able to complete LE therex  without significant difficulty and therapist completed joint mobilization to the Rt knee to further increase flexion and extension. Ended without increase in knee pain and resistance with pt's HEP was progressed. She would continue to benefit from skilled PT moving forward to promote LE strength and flexibility with daily activity.    Rehab Potential  Good    PT Frequency  2x / week    PT Duration  6 weeks    PT Treatment/Interventions  ADLs/Self Care Home Management;Aquatic Therapy;Cryotherapy;Gait training;Functional mobility training;Stair training;Therapeutic activities;Therapeutic exercise;Neuromuscular re-education;Manual techniques;Passive range of motion;Patient/family education;Joint Manipulations    PT Next Visit Plan  progress passive Rt knee extension and flexion, patellar mobilization; continue NuStep, seated, supine, sidelying and standing ther ex as tol    PT Home Exercise Plan  [email or web address]    Consulted and Agree with Plan of Care  Patient       Patient will benefit from skilled therapeutic intervention in order to improve the following deficits and impairments:     Visit Diagnosis: Left knee pain, unspecified chronicity  Right knee pain, unspecified chronicity  Muscle weakness (generalized)     Problem List There are no active problems to display for this patient.   1:30 PM,10/18/19 [place], [place] [facility] [place]  [facility] [address] [place], [place], [address] Phone: [phone]   Fax:  [phone]  Name: April Jones MRN: [id number] Date of Birth: [birth year]

## 2019-10-20 ENCOUNTER — Encounter: Payer: Self-pay | Admitting: Physical Therapy

## 2019-10-20 ENCOUNTER — Ambulatory Visit: Payer: Medicare Other | Admitting: Physical Therapy

## 2019-10-20 ENCOUNTER — Other Ambulatory Visit: Payer: Self-pay

## 2019-10-20 DIAGNOSIS — M25562 Pain in left knee: Secondary | ICD-10-CM | POA: Diagnosis not present

## 2019-10-20 DIAGNOSIS — M6281 Muscle weakness (generalized): Secondary | ICD-10-CM

## 2019-10-20 DIAGNOSIS — M25561 Pain in right knee: Secondary | ICD-10-CM

## 2019-10-20 NOTE — Therapy (Signed)
[facility] [address], [place], [place], [address] Phone: [phone]   Fax:  [phone]  Physical Therapy Treatment  Patient Details  Name: April Jones MRN: [id number] Date of Birth: 1946-02-24 Referring Provider (PT): Dibas Dorthy Cooler, MD    Encounter Date: 10/20/2019  PT End of Session - 10/20/19 1108    Visit Number  7    Date for PT Re-Evaluation  11/10/19    Authorization Type  UHC Medicare    Authorization Time Period  09/27/19 to 11/10/19    PT Start Time  1104    PT Stop Time  1142    PT Time Calculation (min)  38 min    Activity Tolerance  Patient tolerated treatment well;No increased pain    Behavior During Therapy  [facility] for tasks assessed/performed       History reviewed. No pertinent past medical history.  Past Surgical History:  Procedure Laterality Date  . BREAST BIOPSY Right 1978  . BREAST EXCISIONAL BIOPSY Right     There were no vitals filed for this visit.  Subjective Assessment - 10/20/19 1107    Subjective  No pain.  Knee is feeling much better. I have less stiffness and pain.  I am not needing the cane as much.    Limitations  Walking    Patient Stated Goals  decrease pain with activity    Currently in Pain?  No/denies         [facility] PT Assessment - 10/20/19 0001      Transfers   Five time sit to stand comments   11 sec, more equal use of bil LEs      Standardized Balance Assessment   Standardized Balance Assessment  Timed Up and Go Test      Timed Up and Go Test   TUG Comments  11 no cane                   [facility] Adult PT Treatment/Exercise - 10/20/19 0001      Knee/Hip Exercises: Aerobic   Nustep  seat 8: L2 x 6', L4 x 2', PT present to review LTGs      Knee/Hip Exercises: Seated   Long Arc Quad  Strengthening;Both;10 reps;Weights;2 sets    [employer] Weight  2 lbs.    Ball Squeeze  10x3 sec black pad under feet    Other Seated Knee/Hip Exercises   heel/toe raises x 20 bil    Marching  Strengthening;Both;20 reps;Weights    Marching Weights  2 lbs.    Sit to Sand  2 sets;5 reps;with UE support   hands on knees     Knee/Hip Exercises: Supine   Quad Sets  Strengthening;Both;1 set;10 reps    Quad Sets Limitations  with ankles propped, 5 sec holds    [employer]  Strengthening;15 reps    Straight Leg Raises  Strengthening;AROM;10 reps;1 set      Knee/Hip Exercises: Sidelying   Clams  red band, 2x10 bil, small range               PT Short Term Goals - 10/18/19 1200      PT SHORT TERM GOAL #1   Title  Pt will demo consistency and independence with her initial HEP to increase ROM, strength and mobility.    Status  Achieved        PT Long Term Goals - 10/20/19 1108      PT LONG TERM GOAL #  1   Title  Pt will demo improved Rt knee ROM to lacking no more than 15 deg knee extension and atleast 100 deg of knee flexion which will increase her mobility.    Baseline  -25 deg extension, 100 deg flexion    Status  Partially Met      PT LONG TERM GOAL #2   Title  Pt will demo improved BLE strength to atleast 4/5 MMT which will increase her efficiency with activity.    Status  On-going      PT LONG TERM GOAL #3   Title  Pt will be able to complete TUG in less than 14 sec with LRAD which will reflect improvements in balance and stability throughout the community.    Baseline  10/20/19: 11 sec without cane    Status  Achieved      PT LONG TERM GOAL #4   Title  Pt will report atleast 50% improvement in her knee pain from the start of PT with daily activity.    Baseline  70%    Status  Achieved            Plan - 10/20/19 1141    Clinical Impression Statement  Pt met several LTGs today reporting 70-80% improvement in knee pain and achieving TUG in 11 sec without AD.  She is very pleased with her progress and performed more reps with added resistance today with less fatigue end of session suggesting improved strength and  endurance.  She will continue to benefit from skilled PT for strength, ROM and endurance to improve her community independence.    Rehab Potential  Good    PT Frequency  2x / week    PT Duration  6 weeks    PT Treatment/Interventions  ADLs/Self Care Home Management;Aquatic Therapy;Cryotherapy;Gait training;Functional mobility training;Stair training;Therapeutic activities;Therapeutic exercise;Neuromuscular re-education;Manual techniques;Passive range of motion;Patient/family education;Joint Manipulations    PT Next Visit Plan  NuStep, LE strength, Rt knee ROM, manual as needed for knee ROM    PT Home Exercise Plan  [email or web address]    Consulted and Agree with Plan of Care  Patient       Patient will benefit from skilled therapeutic intervention in order to improve the following deficits and impairments:     Visit Diagnosis: Left knee pain, unspecified chronicity  Right knee pain, unspecified chronicity  Muscle weakness (generalized)     Problem List There are no active problems to display for this patient.   Venetia Night Rene Sizelove, PT 10/20/19 11:45 AM   [facility] [address] [place], [place], [address] Phone: [phone]   Fax:  [phone]  Name: April Jones MRN: [id number] Date of Birth: [birth year]

## 2019-10-25 ENCOUNTER — Other Ambulatory Visit: Payer: Self-pay

## 2019-10-25 ENCOUNTER — Ambulatory Visit: Payer: Medicare Other | Admitting: Physical Therapy

## 2019-10-25 DIAGNOSIS — M25562 Pain in left knee: Secondary | ICD-10-CM

## 2019-10-25 DIAGNOSIS — M6281 Muscle weakness (generalized): Secondary | ICD-10-CM

## 2019-10-25 DIAGNOSIS — M25561 Pain in right knee: Secondary | ICD-10-CM

## 2019-10-25 NOTE — Therapy (Signed)
[facility] [address], [place], [place], [address] Phone: [phone]   Fax:  [phone]  Physical Therapy Treatment  Patient Details  Name: April Jones MRN: [id number] Date of Birth: 1946/01/13 Referring Provider (PT): Dibas Dorthy Cooler, MD    Encounter Date: 10/25/2019  PT End of Session - 10/25/19 1149    Visit Number  8    Date for PT Re-Evaluation  11/10/19    Authorization Type  UHC Medicare    Authorization Time Period  09/27/19 to 11/10/19    PT Start Time  1145    PT Stop Time  1226    PT Time Calculation (min)  41 min    Activity Tolerance  Patient tolerated treatment well;No increased pain    Behavior During Therapy  [facility] for tasks assessed/performed       No past medical history on file.  Past Surgical History:  Procedure Laterality Date  . BREAST BIOPSY Right 1978  . BREAST EXCISIONAL BIOPSY Right     There were no vitals filed for this visit.  Subjective Assessment - 10/25/19 1148    Subjective  Pt states things are going well. She is a little more stiff this morning because of the weather, but she is not having pain.    Limitations  Walking    Patient Stated Goals  decrease pain with activity    Currently in Pain?  No/denies                       [facility] Adult PT Treatment/Exercise - 10/25/19 0001      Knee/Hip Exercises: Aerobic   Nustep  Seat 9: L2 x6 min PT present to discuss POC      Knee/Hip Exercises: Machines for Strengthening   Total Gym Leg Press  Seat 8: single leg #35 2x10 reps each side      Knee/Hip Exercises: Standing   Knee Flexion  Right;Left;Strengthening;1 set;10 reps    Knee Flexion Limitations  within available ROM    Abduction Limitations  sidestep and back 2x10 reps (1st set around ankles, 2nd set around knees)       Knee/Hip Exercises: Seated   Long Arc Quad  Both;Strengthening;10 reps;2 sets    [employer] Weight  3 lbs.    Knee/Hip  Flexion  #3 ankle weights 2x10 reps each side       Manual Therapy   Joint Mobilization  Prone: Rt tibial external rotation mobilization in end range extension grade III-IV x3 bouts; seated Rt knee tibiail internal rotation mobilization at end range flexion x3 bouts grade III-IV             PT Education - 10/25/19 1223    Education Details  technique with therex    Person(s) Educated  Patient    Methods  Explanation;Verbal cues    Comprehension  Verbalized understanding;Returned demonstration       PT Short Term Goals - 10/18/19 1200      PT SHORT TERM GOAL #1   Title  Pt will demo consistency and independence with her initial HEP to increase ROM, strength and mobility.    Status  Achieved        PT Long Term Goals - 10/20/19 1108      PT LONG TERM GOAL #1   Title  Pt will demo improved Rt knee ROM to lacking no more than 15 deg knee extension and atleast 100 deg of knee  flexion which will increase her mobility.    Baseline  -25 deg extension, 100 deg flexion    Status  Partially Met      PT LONG TERM GOAL #2   Title  Pt will demo improved BLE strength to atleast 4/5 MMT which will increase her efficiency with activity.    Status  On-going      PT LONG TERM GOAL #3   Title  Pt will be able to complete TUG in less than 14 sec with LRAD which will reflect improvements in balance and stability throughout the community.    Baseline  10/20/19: 11 sec without cane    Status  Achieved      PT LONG TERM GOAL #4   Title  Pt will report atleast 50% improvement in her knee pain from the start of PT with daily activity.    Baseline  70%    Status  Achieved            Plan - 10/25/19 1227    Clinical Impression Statement  Pt continues to deny pain with therex during her sessions and at home. PT introduced several more standing exercises to promote LE strength. Pt struggled with standing knee flexion secondary to knee flexion ROM limitations. She was able to complete leg  press without increase in knee pain. Ended with PT completing manual treatment to progress knee extension/flexion ROM. Pt denied any soreness following this. Will continue with current POC.    Rehab Potential  Good    PT Frequency  2x / week    PT Duration  6 weeks    PT Treatment/Interventions  ADLs/Self Care Home Management;Aquatic Therapy;Cryotherapy;Gait training;Functional mobility training;Stair training;Therapeutic activities;Therapeutic exercise;Neuromuscular re-education;Manual techniques;Passive range of motion;Patient/family education;Joint Manipulations    PT Next Visit Plan  NuStep, LE strength, Rt knee ROM, manual as needed for knee ROM    PT Home Exercise Plan  [email or web address]    Consulted and Agree with Plan of Care  Patient       Patient will benefit from skilled therapeutic intervention in order to improve the following deficits and impairments:     Visit Diagnosis: Left knee pain, unspecified chronicity  Right knee pain, unspecified chronicity  Muscle weakness (generalized)     Problem List There are no active problems to display for this patient.   12:37 PM,10/25/19 Sherol Dade PT, DPT [place] [facility] [address] [place], [place], [address] Phone: [phone]   Fax:  [phone]  Name: April Jones MRN: [id number] Date of Birth: November 11, 1946

## 2019-10-25 NOTE — Patient Instructions (Signed)
Access Code: [email or web address]  URL: [email or web address]  Date: 10/25/2019  Prepared by: Sherol Dade   Exercises  Seated Quad Set - 3 reps - 20 hold - 3x daily - 7x weekly  Quadricep Stretch with Chair and Counter Support - 3 reps - 20 hold - 3x daily - 7x weekly  Clamshell with Resistance - 10 reps - 2 sets - 1x daily - 7x weekly  Seated March with Resistance - 20 reps - 2 sets - 1x daily - 7x weekly  Sit to Stand with Arms Crossed - 5 reps - 2 sets - 1x daily - 7x weekly  Standing Hip Abduction with Resistance at Thighs - 10 reps - 2 sets - 1x daily - 7x weekly  Straight Leg Raise - 10 reps - 2 sets - 1x daily - 7x weekly  Bridge - 10 reps - 2 sets - 1x daily - 7x weekly    [facility] [address], [place], [place] [address] Phone # [phone] Fax [phone]

## 2019-10-27 ENCOUNTER — Encounter: Payer: Self-pay | Admitting: Physical Therapy

## 2019-10-27 ENCOUNTER — Ambulatory Visit: Payer: Medicare Other | Admitting: Physical Therapy

## 2019-10-27 ENCOUNTER — Other Ambulatory Visit: Payer: Self-pay

## 2019-10-27 DIAGNOSIS — M25561 Pain in right knee: Secondary | ICD-10-CM

## 2019-10-27 DIAGNOSIS — M6281 Muscle weakness (generalized): Secondary | ICD-10-CM

## 2019-10-27 DIAGNOSIS — M25562 Pain in left knee: Secondary | ICD-10-CM | POA: Diagnosis not present

## 2019-10-27 NOTE — Therapy (Signed)
[facility] [address] [place], [place], [address] Phone: [phone]   Fax:  [phone]  Physical Therapy Treatment  Patient Details  Name: April Jones MRN: [id number] Date of Birth: [birth year] Referring Provider (PT): Dibas Dorthy Cooler, MD    Encounter Date: 10/27/2019  PT End of Session - 10/27/19 1116    Visit Number  9    Date for PT Re-Evaluation  11/10/19    Authorization Type  [employer]HC Medicare    Authorization Time Period  09/27/19 to 11/10/19    PT Start Time  1105    PT Stop Time  1143    PT Time Calculation (min)  38 min    Activity Tolerance  Patient tolerated treatment well;No increased pain    Behavior During Therapy  [facility] for tasks assessed/performed       History reviewed. No pertinent past medical history.  Past Surgical History:  Procedure Laterality Date  . BREAST BIOPSY Right 1978  . BREAST EXCISIONAL BIOPSY Right     There were no vitals filed for this visit.  Subjective Assessment - 10/27/19 1115    Subjective  Knee is doing great.  Neuropathy is acting up, comes and goes.    Limitations  Walking    Patient Stated Goals  decrease pain with activity    Currently in Pain?  No/denies    Pain Onset  More than a month ago                       [facility] Adult PT Treatment/Exercise - 10/27/19 0001      Ambulation/Gait   Curb  5: Supervision;6: Modified independent (Device/increase time)   3 reps   Curb Details (indicate cue type and reason)  Pt step down sideways, felt more comfortable holding onto car      Knee/Hip Exercises: Aerobic   Nustep  Seat 9: L3 x 10 min PT present to discuss POC      Knee/Hip Exercises: Machines for Strengthening   Total Gym Leg Press  Seat 8: single leg #30 2x10 reps each side   decreased from 35# per Pt request     Knee/Hip Exercises: Standing   Hip Abduction  Stengthening;Both;5 reps;Knee straight    Abduction Limitations  2.5# at  counter    Hip Extension  Stengthening;1 set;Both;5 reps;Knee straight    Extension Limitations  2.5# at counter    Forward Step Up  Both;5 reps;Hand Hold: 2;Step Height: 6"      Knee/Hip Exercises: Seated   Long Arc Quad  Strengthening;Both;2 sets;10 reps;Weights    Long Arc Quad Weight  3 lbs.    Knee/Hip Flexion  3# ankle weight x 20 reps bil               PT Short Term Goals - 10/18/19 1200      PT SHORT TERM GOAL #1   Title  Pt will demo consistency and independence with her initial HEP to increase ROM, strength and mobility.    Status  Achieved        PT Long Term Goals - 10/20/19 1108      PT LONG TERM GOAL #1   Title  Pt will demo improved Rt knee ROM to lacking no more than 15 deg knee extension and atleast 100 deg of knee flexion which will increase her mobility.    Baseline  -25 deg extension, 100 deg flexion    Status  Partially  Met      PT LONG TERM GOAL #2   Title  Pt will demo improved BLE strength to atleast 4/5 MMT which will increase her efficiency with activity.    Status  On-going      PT LONG TERM GOAL #3   Title  Pt will be able to complete TUG in less than 14 sec with LRAD which will reflect improvements in balance and stability throughout the community.    Baseline  10/20/19: 11 sec without cane    Status  Achieved      PT LONG TERM GOAL #4   Title  Pt will report atleast 50% improvement in her knee pain from the start of PT with daily activity.    Baseline  70%    Status  Achieved            Plan - 10/27/19 1116    Clinical Impression Statement  Pt continues to have limited Rt knee ROM extension > flexion but without pain.  Pt able to tolerate all ther ex and desired activities throughout the day without pain or limitation.  She does report modified technique with climbing stairs, descending stairs, stepping off curbs, and limits walking distances.  PT worked with Pt on safety strategy at curb for sidestepping off curb with  CGA/supervision.  10th visit PN next visit.  Continue skilled PT along current POC.    Examination-Activity Limitations  Squat;Stairs;Locomotion Level    Rehab Potential  Good    PT Frequency  2x / week    PT Duration  6 weeks    PT Treatment/Interventions  ADLs/Self Care Home Management;Aquatic Therapy;Cryotherapy;Gait training;Functional mobility training;Stair training;Therapeutic activities;Therapeutic exercise;Neuromuscular re-education;Manual techniques;Passive range of motion;Patient/family education;Joint Manipulations    PT Next Visit Plan  10th visit PN next visit, NuStep, LE strength, Rt knee ROM, manual as needed for knee ROM, step ups/downs    PT Home Exercise Plan  [email or web address]    Consulted and Agree with Plan of Care  Patient       Patient will benefit from skilled therapeutic intervention in order to improve the following deficits and impairments:     Visit Diagnosis: Left knee pain, unspecified chronicity  Right knee pain, unspecified chronicity  Muscle weakness (generalized)     Problem List There are no active problems to display for this patient.   Venetia Night Jacaden Forbush, PT 10/27/19 11:45 AM   [facility] [address] [place], [place], [address] Phone: [phone]   Fax:  [phone]  Name: Venesa Semidey MRN: [id number] Date of Birth: [birth year]

## 2019-11-01 ENCOUNTER — Ambulatory Visit: Payer: Medicare Other | Admitting: Physical Therapy

## 2019-11-01 ENCOUNTER — Encounter: Payer: Self-pay | Admitting: Physical Therapy

## 2019-11-01 ENCOUNTER — Other Ambulatory Visit: Payer: Self-pay

## 2019-11-01 DIAGNOSIS — M6281 Muscle weakness (generalized): Secondary | ICD-10-CM

## 2019-11-01 DIAGNOSIS — M25562 Pain in left knee: Secondary | ICD-10-CM | POA: Diagnosis not present

## 2019-11-01 DIAGNOSIS — M25561 Pain in right knee: Secondary | ICD-10-CM

## 2019-11-01 NOTE — Patient Instructions (Signed)
Access Code: [email or web address]  URL: [email or web address]  Date: 11/01/2019  Prepared by: Sherol Dade   Exercises  Seated Quad Set - 3 reps - 20 hold - 3x daily - 7x weekly  Quadricep Stretch with Chair and Counter Support - 3 reps - 20 hold - 3x daily - 7x weekly  Clamshell with Resistance - 10 reps - 2 sets - 1x daily - 7x weekly  Sit to Stand with Arms Crossed - 5 reps - 2 sets - 1x daily - 7x weekly  Standing Hip Abduction with Resistance at Thighs - 10 reps - 2 sets - 1x daily - 7x weekly  Seated Knee Extension Stretch with Chair - 10 reps - 3 sets - 1x daily - 7x weekly    [facility] [address] [place], [place] [address] Phone # [phone] Fax [phone]

## 2019-11-01 NOTE — Therapy (Signed)
[facility] [address], [place], [place], [address] Phone: [phone]   Fax:  [phone]  Physical Therapy Treatment  Patient Details  Name: April Jones MRN: [id number] Date of Birth: [birth year] Referring Provider (PT): Dibas Dorthy Cooler, MD   Progress Note Reporting Period 09/27/19 to 11/01/19  See note below for Objective Data and Assessment of Progress/Goals.       Encounter Date: 11/01/2019  PT End of Session - 11/01/19 1220    Visit Number  10    Date for PT Re-Evaluation  11/10/19    Authorization Type  [employer] Medicare    Authorization Time Period  09/27/19 to 11/10/19    PT Start Time  1146    PT Stop Time  1228    PT Time Calculation (min)  42 min    Activity Tolerance  Patient tolerated treatment well;No increased pain    Behavior During Therapy  [facility] for tasks assessed/performed       History reviewed. No pertinent past medical history.  Past Surgical History:  Procedure Laterality Date  . BREAST BIOPSY Right 1978  . BREAST EXCISIONAL BIOPSY Right     There were no vitals filed for this visit.  Subjective Assessment - 11/01/19 1149    Subjective  Pt states that her knees are feeling a little stiff with the cold weather. Otherwise, things are going great.    Limitations  Walking    Patient Stated Goals  decrease pain with activity    Currently in Pain?  No/denies    Pain Onset  More than a month ago         [facility] PT Assessment - 11/01/19 0001      Assessment   Medical Diagnosis  B knee osteoarthritis    Referring Provider (PT)  Dibas Koirala, MD     Onset Date/Surgical Date  --   March 2020   Prior Therapy  none       Precautions   Precautions  None      Restrictions   Weight Bearing Restrictions  No      Home Environment   Additional Comments  no steps at home, a small incline to get in      Prior Function   Level of Independence  Independent      Cognition   Overall Cognitive Status  Within Functional Limits for tasks assessed      Observation/Other Assessments   Observations  --    Focus on Therapeutic Outcomes (FOTO)   27% limited       AROM   Overall AROM Comments  Rt knee: lacking 30 deg, 95 deg flexion; Lt: 5 deg lacking extension, 120 deg flexion      Strength   Right Hip Flexion  5/5    Right Hip Extension  5/5    Right Hip ABduction  5/5    Left Hip Flexion  5/5    Left Hip Extension  5/5    Left Hip ABduction  5/5    Right Knee Flexion  5/5    Right Knee Extension  5/5    Left Knee Flexion  5/5    Left Knee Extension  5/5      Flexibility   Soft Tissue Assessment /Muscle Length  yes    Quadriceps  prone: Rt 100 deg, Lt: 90 deg       Transfers   Five time sit to stand comments   9 sec, minimal weight shift  Lt      Ambulation/Gait   Gait Comments  Pt ambulating without AD, decreased knee extension noted on Rt       Standardized Balance Assessment   Standardized Balance Assessment  Timed Up and Go Test      Timed Up and Go Test   TUG Comments  12 sec, no AD                   [facility] Adult PT Treatment/Exercise - 11/01/19 0001      Knee/Hip Exercises: Stretches   Other Knee/Hip Stretches  Rt knee extension stretch with LE in chair and 5# weight on thigh x3 min      Knee/Hip Exercises: Machines for Strengthening   Total Gym Leg Press  seat 8: single leg #30 2x10 reps each       Knee/Hip Exercises: Seated   Other Seated Knee/Hip Exercises  Rt knee extension stretch with UE pressure on thigh 5x10 sec hold       Manual Therapy   Joint Mobilization  Rt knee extension overpressure with tibial ER x10             PT Education - 11/01/19 1232    Education Details  technique with therex    Person(s) Educated  Patient    Methods  Explanation;Handout    Comprehension  Verbalized understanding       PT Short Term Goals - 11/01/19 1205      PT SHORT TERM GOAL #1   Title  Pt will demo consistency and  independence with her initial HEP to increase ROM, strength and mobility.    Status  Achieved        PT Long Term Goals - 11/01/19 1205      PT LONG TERM GOAL #1   Title  Pt will demo improved Rt knee ROM to lacking no more than 15 deg knee extension and atleast 100 deg of knee flexion which will increase her mobility.    Baseline  -25 deg extension, 105 deg flexion    Status  Partially Met      PT LONG TERM GOAL #2   Title  Pt will demo improved BLE strength to atleast 4/5 MMT which will increase her efficiency with activity.    Baseline  5/5 MMT    Status  Achieved      PT LONG TERM GOAL #3   Title  Pt will be able to complete TUG in less than 14 sec with LRAD which will reflect improvements in balance and stability throughout the community.    Baseline  10/20/19: 11 sec without cane    Status  Achieved      PT LONG TERM GOAL #4   Title  Pt will report atleast 50% improvement in her knee pain from the start of PT with daily activity.    Baseline  70%    Status  Achieved            Plan - 11/01/19 1226    Clinical Impression Statement  Pt is making steady progress towards her goals. She performed well on both the TUG and 5x sit to stand, meeting these goals today. Her strength is improved to greater than 4/5 MMT. Pt's knee ROM, although improved from the evaluation, remains her greatest limitation. She lacks atleast 25 deg of Rt knee extension, with flexion increased to 105 deg. Pt's HEP was updated and PT reviewed ways to address knee extension ROM at home. She  demonstrated good understanding of this and denied any pain end of session. Pt would continue to benefit from skilled PT to further address knee ROM limitations and begin to finalize independence with an advanced HEP.    Examination-Activity Limitations  Squat;Stairs;Locomotion Level    Rehab Potential  Good    PT Frequency  2x / week    PT Duration  6 weeks    PT Treatment/Interventions  ADLs/Self Care Home  Management;Aquatic Therapy;Cryotherapy;Gait training;Functional mobility training;Stair training;Therapeutic activities;Therapeutic exercise;Neuromuscular re-education;Manual techniques;Passive range of motion;Patient/family education;Joint Manipulations    PT Next Visit Plan  manual to increase Rt knee extension; begin to finalize HEP for strength; proprioception activity    PT Home Exercise Plan  [email or web address]    Consulted and Agree with Plan of Care  Patient       Patient will benefit from skilled therapeutic intervention in order to improve the following deficits and impairments:     Visit Diagnosis: Left knee pain, unspecified chronicity  Right knee pain, unspecified chronicity  Muscle weakness (generalized)     Problem List There are no active problems to display for this patient.   12:32 PM,11/01/19 Sherol Dade PT, DPT [place] [facility] [place]  [facility] [address], [place], [place], [address] Phone: [phone]   Fax:  [phone]  Name: Taitum Menton MRN: [id number] Date of Birth: 07-Dec-1946

## 2019-11-03 ENCOUNTER — Ambulatory Visit: Payer: Medicare Other | Admitting: Physical Therapy

## 2019-11-03 ENCOUNTER — Encounter: Payer: Self-pay | Admitting: Physical Therapy

## 2019-11-03 ENCOUNTER — Other Ambulatory Visit: Payer: Self-pay

## 2019-11-03 DIAGNOSIS — M25562 Pain in left knee: Secondary | ICD-10-CM | POA: Diagnosis not present

## 2019-11-03 DIAGNOSIS — M25561 Pain in right knee: Secondary | ICD-10-CM

## 2019-11-03 DIAGNOSIS — M6281 Muscle weakness (generalized): Secondary | ICD-10-CM

## 2019-11-03 NOTE — Therapy (Signed)
[facility] [address] [place], [place], [address] Phone: [phone]   Fax:  [phone]  Physical Therapy Treatment  Patient Details  Name: April Jones MRN: [id number] Date of Birth: [birth year] Referring Provider (PT): Dibas Dorthy Cooler, MD    Encounter Date: 11/03/2019  PT End of Session - 11/03/19 1020    Visit Number  11    Date for PT Re-Evaluation  11/10/19    Authorization Type  [employer] Medicare    Authorization Time Period  09/27/19 to 11/10/19    PT Start Time  1018    PT Stop Time  1103    PT Time Calculation (min)  45 min    Activity Tolerance  Patient tolerated treatment well    Behavior During Therapy  [facility] for tasks assessed/performed       History reviewed. No pertinent past medical history.  Past Surgical History:  Procedure Laterality Date  . BREAST BIOPSY Right 1978  . BREAST EXCISIONAL BIOPSY Right     There were no vitals filed for this visit.  Subjective Assessment - 11/03/19 1021    Subjective  No pain this AM just stiff.  Yesterday I was walking really good.    Currently in Pain?  No/denies    Multiple Pain Sites  No         [facility] PT Assessment - 11/03/19 0001      AROM   Overall AROM Comments  Active knee extension 22 degrees from 0.                   [facility] PT Treatment/Exercise - 11/03/19 0001      Knee/Hip Exercises: Stretches   Active Hamstring Stretch  Right;3 reps;20 seconds    Active Hamstring Stretch Limitations  Seated and in long sitting      Knee/Hip Exercises: Aerobic   Nustep  L2 x 6 min with discussion of ststus and how OA effects the knees      Knee/Hip Exercises: Machines for Strengthening   Total Gym Leg Press  seat 8: single leg #30 3x10 reps each       Knee/Hip Exercises: Seated   Heel Slides  AROM;Both;20 reps    Heel Slides Limitations  Used flor slider      Manual Therapy   Manual Therapy  Soft tissue mobilization    Soft  tissue mobilization  Posterior knee/hamstings proximal gastro, ITband and quads             PT Education - 11/03/19 1107    Education Details  HEP for hamstring stretching in sitting and long sittin gwith a strap    Person(s) Educated  Patient    Methods  Explanation;Demonstration;Tactile cues;Verbal cues;Handout    Comprehension  Returned demonstration;Verbalized understanding       PT Short Term Goals - 11/01/19 1205      PT SHORT TERM GOAL #1   Title  Pt will demo consistency and independence with her initial HEP to increase ROM, strength and mobility.    Status  Achieved        PT Long Term Goals - 11/01/19 1205      PT LONG TERM GOAL #1   Title  Pt will demo improved Rt knee ROM to lacking no more than 15 deg knee extension and atleast 100 deg of knee flexion which will increase her mobility.    Baseline  -25 deg extension, 105 deg flexion    Status  Partially Met      PT LONG TERM GOAL #2   Title  Pt will demo improved BLE strength to atleast 4/5 MMT which will increase her efficiency with activity.    Baseline  5/5 MMT    Status  Achieved      PT LONG TERM GOAL #3   Title  Pt will be able to complete TUG in less than 14 sec with LRAD which will reflect improvements in balance and stability throughout the community.    Baseline  10/20/19: 11 sec without cane    Status  Achieved      PT LONG TERM GOAL #4   Title  Pt will report atleast 50% improvement in her knee pain from the start of PT with daily activity.    Baseline  70%    Status  Achieved            Plan - 11/03/19 1020    Clinical Impression Statement  Pt arrives with reports of a "very stiff knee." She reports the day before her walking was better but the cold weather is "seizing it up." Posterior RTLE soft tissues tight that responded well to soft tissue work at the end of the session. Treatment focused on ways to increase her Rt knee extension inlcuding adding a few new stretches to her HEP.  Pt's active knee extension at end of session was 22 degrees in long sitting with LTLE on the floor.    Personal Factors and Comorbidities  Age    Examination-Activity Limitations  Squat;Stairs;Locomotion Level    Examination-Participation Restrictions  Other    Stability/Clinical Decision Making  Stable/Uncomplicated    Rehab Potential  Good    PT Frequency  2x / week    PT Duration  6 weeks    PT Treatment/Interventions  ADLs/Self Care Home Management;Aquatic Therapy;Cryotherapy;Gait training;Functional mobility training;Stair training;Therapeutic activities;Therapeutic exercise;Neuromuscular re-education;Manual techniques;Passive range of motion;Patient/family education;Joint Manipulations    PT Next Visit Plan  manual to increase Rt knee extension; begin to finalize HEP for strength; proprioception activity    PT Home Exercise Plan  [email or web address]    Consulted and Agree with Plan of Care  Patient       Patient will benefit from skilled therapeutic intervention in order to improve the following deficits and impairments:  Abnormal gait, Decreased balance, Decreased range of motion, Decreased strength, Pain, Improper body mechanics, Impaired flexibility, Hypomobility, Difficulty walking  Visit Diagnosis: Left knee pain, unspecified chronicity  Right knee pain, unspecified chronicity  Muscle weakness (generalized)     Problem List There are no active problems to display for this patient.   Fiora Weill,PTA 11/03/2019, 11:08 AM  [facility] [address] [place], [place], [address] Phone: [phone]   Fax:  [phone]  Name: Chinyere Galiano MRN: [id number] Date of Birth: [birth year]  Access Code: [email or web address]  URL: [email or web address]  Date: 11/03/2019  Prepared by: Myrene Galas   Exercises  Seated Quad Set - 3 reps - 20 hold - 3x daily - 7x weekly  Quadricep Stretch with Chair and Counter  Support - 3 reps - 20 hold - 3x daily - 7x weekly  Clamshell with Resistance - 10 reps - 2 sets - 1x daily - 7x weekly  Sit to Stand with Arms Crossed - 5 reps - 2 sets - 1x daily - 7x weekly  Standing Hip Abduction with Resistance at Thighs - 10 reps - 2 sets - 1x daily - 7x weekly  Seated Knee Extension Stretch with Chair - 10 reps - 3 sets - 1x daily - 7x weekly  Seated Hamstring Stretch - 10 reps - 1 sets - 20 hold - 2x daily - 7x weekly  Seated Table Hamstring Stretch - 3 reps - 1 sets - 1x daily - 7x weekly

## 2019-11-08 ENCOUNTER — Encounter: Payer: Self-pay | Admitting: Physical Therapy

## 2019-11-08 ENCOUNTER — Ambulatory Visit: Payer: Medicare Other | Admitting: Physical Therapy

## 2019-11-08 ENCOUNTER — Other Ambulatory Visit: Payer: Self-pay

## 2019-11-08 DIAGNOSIS — M25562 Pain in left knee: Secondary | ICD-10-CM

## 2019-11-08 DIAGNOSIS — M6281 Muscle weakness (generalized): Secondary | ICD-10-CM

## 2019-11-08 DIAGNOSIS — M25561 Pain in right knee: Secondary | ICD-10-CM

## 2019-11-08 NOTE — Patient Instructions (Signed)
Access Code: [email or web address]  URL: [email or web address]  Date: 11/08/2019  Prepared by: Sherol Dade   Exercises  Seated Quad Set - 3 reps - 20 hold - 3x daily - 7x weekly  Quadricep Stretch with Chair and Counter Support - 3 reps - 20 hold - 3x daily - 7x weekly  Clamshell with Resistance - 10 reps - 2 sets - 1x daily - 7x weekly  Sit to Stand with Arms Crossed - 5 reps - 2 sets - 1x daily - 7x weekly  Seated Knee Extension Stretch with Chair - 10 reps - 3 sets - 1x daily - 7x weekly  Standing Hip Abduction with Resistance at Thighs - 10 reps - 2 sets - 1x daily - 7x weekly  Seated Table Hamstring Stretch - 3 reps - 1 sets - 1x daily - 7x weekly  Standing Heel Raises - 20 reps - 1x daily - 7x weekly  Seated Hamstring Curl with Anchored Resistance - 15 reps - 2 sets - 1x daily - 7x weekly    [facility] [address], [place], [place] [address] Phone # [phone] Fax [phone]

## 2019-11-08 NOTE — Therapy (Signed)
[facility] [address] [place], [place], [address] Phone: [phone]   Fax:  [phone]  Physical Therapy Treatment/Discharge  Patient Details  Name: April Jones MRN: [id number] Date of Birth: July 18, 1946 Referring Provider (PT): Dibas Dorthy Cooler, MD    Encounter Date: 11/08/2019  PT End of Session - 11/08/19 1239    Visit Number  12    Date for PT Re-Evaluation  11/10/19    Authorization Type  [employer]HC Medicare    Authorization Time Period  09/27/19 to 11/10/19    PT Start Time  1201    PT Stop Time  1239    PT Time Calculation (min)  38 min    Activity Tolerance  Patient tolerated treatment well;No increased pain    Behavior During Therapy  [facility] for tasks assessed/performed       History reviewed. No pertinent past medical history.  Past Surgical History:  Procedure Laterality Date  . BREAST BIOPSY Right 1978  . BREAST EXCISIONAL BIOPSY Right     There were no vitals filed for this visit.      [facility] PT Assessment - 11/08/19 0001      Assessment   Medical Diagnosis  B knee osteoarthritis    Referring Provider (PT)  Dibas Koirala, MD     Onset Date/Surgical Date  --   March 2020   Prior Therapy  none       Precautions   Precautions  None      Restrictions   Weight Bearing Restrictions  No      Home Environment   Additional Comments  no steps at home, a small incline to get in      Prior Function   Level of Independence  Independent      Cognition   Overall Cognitive Status  Within Functional Limits for tasks assessed      Observation/Other Assessments   Focus on Therapeutic Outcomes (FOTO)   27% limited       AROM   Overall AROM Comments  Rt knee: 20 deg lacking, 100 deg flexion      Strength   Right Hip Flexion  5/5    Right Hip Extension  5/5    Right Hip ABduction  5/5    Left Hip Flexion  5/5    Left Hip Extension  5/5    Left Hip ABduction  5/5    Right Knee Flexion  5/5     Right Knee Extension  5/5    Left Knee Flexion  5/5    Left Knee Extension  5/5      Flexibility   Soft Tissue Assessment /Muscle Length  yes    Quadriceps  prone: Rt 100 deg, Lt: 90 deg       Transfers   Five time sit to stand comments   9 sec, minimal weight shift Lt      Ambulation/Gait   Gait Comments  Pt ambulating without AD, decreased knee extension noted on Rt       Standardized Balance Assessment   Standardized Balance Assessment  Timed Up and Go Test      Timed Up and Go Test   TUG Comments  12 sec, no AD                   [facility] Adult PT Treatment/Exercise - 11/08/19 0001      Knee/Hip Exercises: Aerobic   Nustep  L2 x6 min, PT present to discuss  HEP adherence moving forward      Knee/Hip Exercises: Standing   Heel Raises  Both;1 set;20 reps    Hip Abduction  Stengthening;Both;1 set;15 reps    Abduction Limitations  step out/back green TB around knees; sidestepping along countertop with red TB around ankles       Knee/Hip Exercises: Seated   Heel Slides  Strengthening;Both;2 sets;15 reps    Heel Slides Limitations  red TB              PT Education - 11/08/19 1239    Education Details  updated and reviewed HEP    Person(s) Educated  Patient    Methods  Explanation;Handout;Verbal cues    Comprehension  Verbalized understanding;Returned demonstration       PT Short Term Goals - 11/08/19 1243      PT SHORT TERM GOAL #1   Title  Pt will demo consistency and independence with her initial HEP to increase ROM, strength and mobility.    Status  Achieved        PT Long Term Goals - 11/08/19 1243      PT LONG TERM GOAL #1   Title  Pt will demo improved Rt knee ROM to lacking no more than 15 deg knee extension and atleast 100 deg of knee flexion which will increase her mobility.    Baseline  -20 deg extension, 105 deg flexion    Status  Partially Met      PT LONG TERM GOAL #2   Title  Pt will demo improved BLE strength to atleast 4/5  MMT which will increase her efficiency with activity.    Baseline  5/5 MMT    Status  Achieved      PT LONG TERM GOAL #3   Title  Pt will be able to complete TUG in less than 14 sec with LRAD which will reflect improvements in balance and stability throughout the community.    Baseline  10/20/19: 11 sec without cane    Status  Achieved      PT LONG TERM GOAL #4   Title  Pt will report atleast 50% improvement in her knee pain from the start of PT with daily activity.    Baseline  70%    Status  Achieved            Plan - 11/08/19 1241    Clinical Impression Statement  Pt was discharged this visit having met all of her short and long term goals. She has gained 10 deg of Rt knee extension, her strength is greater than 4/5 MMT and she is now ambulating regularly without an AD. Pt is consistently completing her HEP without any reported issues and this was updated today. Pt was encouraged to continue with her HEP in the coming weeks in order to maintain her strength, flexibility and mobility.    Personal Factors and Comorbidities  Age    Examination-Activity Limitations  Squat;Stairs;Locomotion Level    Examination-Participation Restrictions  Other    Stability/Clinical Decision Making  Stable/Uncomplicated    Rehab Potential  Good    PT Frequency  2x / week    PT Duration  6 weeks    PT Treatment/Interventions  ADLs/Self Care Home Management;Aquatic Therapy;Cryotherapy;Gait training;Functional mobility training;Stair training;Therapeutic activities;Therapeutic exercise;Neuromuscular re-education;Manual techniques;Passive range of motion;Patient/family education;Joint Manipulations    PT Next Visit Plan  d/c    PT Home Exercise Plan  [email or web address]    Consulted and Agree with Plan of Care  Patient  Patient will benefit from skilled therapeutic intervention in order to improve the following deficits and impairments:  Abnormal gait, Decreased balance, Decreased range of motion, Decreased  strength, Pain, Improper body mechanics, Impaired flexibility, Hypomobility, Difficulty walking  Visit Diagnosis: Left knee pain, unspecified chronicity  Right knee pain, unspecified chronicity  Muscle weakness (generalized)    PHYSICAL THERAPY DISCHARGE SUMMARY  Visits from Start of Care: 12  Current functional level related to goals / functional outcomes: See above for more details    Remaining deficits: See above for more details    Education / Equipment: See above for more details   Plan: Patient agrees to discharge.  Patient goals were met. Patient is being discharged due to meeting the stated rehab goals.  ?????       Problem List There are no active problems to display for this patient.   12:46 PM,11/08/19 Aarna Mihalko PT, DPT [place] [facility] [place]  [facility] [address] [place], [place], [address] Phone: [phone]   Fax:  [phone]  Name: April Jones MRN: [id number] Date of Birth: [birth year]

## 2020-05-15 DIAGNOSIS — M25561 Pain in right knee: Secondary | ICD-10-CM | POA: Diagnosis not present

## 2020-05-17 DIAGNOSIS — M25561 Pain in right knee: Secondary | ICD-10-CM | POA: Diagnosis not present

## 2020-05-21 DIAGNOSIS — M25561 Pain in right knee: Secondary | ICD-10-CM | POA: Diagnosis not present

## 2020-05-23 DIAGNOSIS — M25561 Pain in right knee: Secondary | ICD-10-CM | POA: Diagnosis not present

## 2020-05-25 DIAGNOSIS — Z03818 Encounter for observation for suspected exposure to other biological agents ruled out: Secondary | ICD-10-CM | POA: Diagnosis not present

## 2020-05-25 DIAGNOSIS — Z20822 Contact with and (suspected) exposure to covid-19: Secondary | ICD-10-CM | POA: Diagnosis not present

## 2020-06-03 DIAGNOSIS — Z471 Aftercare following joint replacement surgery: Secondary | ICD-10-CM | POA: Diagnosis not present

## 2020-06-03 DIAGNOSIS — M1711 Unilateral primary osteoarthritis, right knee: Secondary | ICD-10-CM | POA: Diagnosis not present

## 2020-06-03 DIAGNOSIS — Z96642 Presence of left artificial hip joint: Secondary | ICD-10-CM | POA: Diagnosis not present

## 2020-06-04 DIAGNOSIS — M25561 Pain in right knee: Secondary | ICD-10-CM | POA: Diagnosis not present

## 2020-06-06 DIAGNOSIS — M25561 Pain in right knee: Secondary | ICD-10-CM | POA: Diagnosis not present

## 2020-06-06 DIAGNOSIS — Z96642 Presence of left artificial hip joint: Secondary | ICD-10-CM | POA: Diagnosis not present

## 2020-06-14 ENCOUNTER — Ambulatory Visit
Admission: RE | Admit: 2020-06-14 | Discharge: 2020-06-14 | Disposition: A | Discharge status: Home / Self Care | Payer: Medicare PPO | Source: Ambulatory Visit | Attending: Family Medicine | Admitting: Family Medicine

## 2020-06-14 ENCOUNTER — Other Ambulatory Visit: Payer: Self-pay | Admitting: Family Medicine

## 2020-06-14 DIAGNOSIS — I700 Atherosclerosis of aorta: Secondary | ICD-10-CM | POA: Diagnosis not present

## 2020-06-14 DIAGNOSIS — M25561 Pain in right knee: Secondary | ICD-10-CM | POA: Diagnosis not present

## 2020-06-14 DIAGNOSIS — Z01818 Encounter for other preprocedural examination: Secondary | ICD-10-CM

## 2020-07-03 DIAGNOSIS — M25552 Pain in left hip: Secondary | ICD-10-CM | POA: Diagnosis not present

## 2020-07-03 DIAGNOSIS — Z96642 Presence of left artificial hip joint: Secondary | ICD-10-CM | POA: Diagnosis not present

## 2020-07-12 DIAGNOSIS — M1990 Unspecified osteoarthritis, unspecified site: Secondary | ICD-10-CM | POA: Diagnosis not present

## 2020-07-31 DIAGNOSIS — E669 Obesity, unspecified: Secondary | ICD-10-CM | POA: Diagnosis not present

## 2020-07-31 DIAGNOSIS — M2550 Pain in unspecified joint: Secondary | ICD-10-CM | POA: Diagnosis not present

## 2020-07-31 DIAGNOSIS — Z6833 Body mass index (BMI) 33.0-33.9, adult: Secondary | ICD-10-CM | POA: Diagnosis not present

## 2020-07-31 DIAGNOSIS — R5382 Chronic fatigue, unspecified: Secondary | ICD-10-CM | POA: Diagnosis not present

## 2020-08-01 DIAGNOSIS — E118 Type 2 diabetes mellitus with unspecified complications: Secondary | ICD-10-CM | POA: Diagnosis not present

## 2020-08-01 DIAGNOSIS — I10 Essential (primary) hypertension: Secondary | ICD-10-CM | POA: Diagnosis not present

## 2020-08-01 DIAGNOSIS — E785 Hyperlipidemia, unspecified: Secondary | ICD-10-CM | POA: Diagnosis not present

## 2020-08-01 DIAGNOSIS — M2550 Pain in unspecified joint: Secondary | ICD-10-CM | POA: Diagnosis not present

## 2020-08-08 DIAGNOSIS — E785 Hyperlipidemia, unspecified: Secondary | ICD-10-CM | POA: Diagnosis not present

## 2020-08-08 DIAGNOSIS — E119 Type 2 diabetes mellitus without complications: Secondary | ICD-10-CM | POA: Diagnosis not present

## 2020-08-08 DIAGNOSIS — L400 Psoriasis vulgaris: Secondary | ICD-10-CM | POA: Diagnosis not present

## 2020-08-08 DIAGNOSIS — Z6835 Body mass index (BMI) 35.0-35.9, adult: Secondary | ICD-10-CM | POA: Diagnosis not present

## 2020-08-08 DIAGNOSIS — E1142 Type 2 diabetes mellitus with diabetic polyneuropathy: Secondary | ICD-10-CM | POA: Diagnosis not present

## 2020-08-08 DIAGNOSIS — I10 Essential (primary) hypertension: Secondary | ICD-10-CM | POA: Diagnosis not present

## 2020-08-08 DIAGNOSIS — L298 Other pruritus: Secondary | ICD-10-CM | POA: Diagnosis not present

## 2020-08-09 NOTE — Progress Notes (Signed)
Pt. Needs orders for the upcomming surgery.PST appointment on 08/12/20.Thanks. 

## 2020-08-09 NOTE — Patient Instructions (Addendum)
DUE TO COVID-19 ONLY ONE VISITOR IS ALLOWED TO COME WITH YOU AND STAY IN THE WAITING ROOM ONLY DURING PRE OP AND PROCEDURE DAY OF SURGERY. THE 1 VISITOR  MAY VISIT WITH YOU AFTER SURGERY IN YOUR PRIVATE ROOM DURING VISITING HOURS ONLY!  YOU NEED TO HAVE A COVID 19 TEST ON: 09/21/20 @ 9:00 am , THIS TEST MUST BE DONE BEFORE SURGERY,  COVID TESTING SITE [address] [place]  [id number], IT IS ON THE RIGHT GOING OUT [address] APPROXIMATELY  2 MINUTES PAST [employer] ON THE RIGHT. ONCE YOUR COVID TEST IS COMPLETED,  PLEASE BEGIN THE QUARANTINE INSTRUCTIONS AS OUTLINED IN YOUR HANDOUT.                April Jones   Your procedure is scheduled on:    Report to [facility] Main  Entrance   Report to admitting at: 09/21/20 AM     Call this number if you have problems the morning of surgery [phone]    Remember: Do not eat food or drink liquids :After Midnight.   BRUSH YOUR TEETH MORNING OF SURGERY AND RINSE YOUR MOUTH OUT, NO CHEWING GUM CANDY OR MINTS.     Take these medicines the morning of surgery with A SIP OF WATER: amlodipine,gabapentin,duloxetine,loratadine,metoprolol,omeprazole.Use eye drops as usual.  How to Manage Your Diabetes Before and After Surgery  Why is it important to control my blood sugar before and after surgery? . Improving blood sugar levels before and after surgery helps healing and can limit problems. . A way of improving blood sugar control is eating a healthy diet by: o  Eating less sugar and carbohydrates o  Increasing activity/exercise o  Talking with your doctor about reaching your blood sugar goals . High blood sugars (greater than 180 mg/dL) can raise your risk of infections and slow your recovery, so you will need to focus on controlling your diabetes during the weeks before surgery. . Make sure that the doctor who takes care of your diabetes knows about your planned surgery including the date and location.  How  do I manage my blood sugar before surgery? . Check your blood sugar at least 4 times a day, starting 2 days before surgery, to make sure that the level is not too high or low. o Check your blood sugar the morning of your surgery when you wake up and every 2 hours until you get to the Short Stay unit. . If your blood sugar is less than 70 mg/dL, you will need to treat for low blood sugar: o Do not take insulin. o Treat a low blood sugar (less than 70 mg/dL) with  cup of clear juice (cranberry or apple), 4 glucose tablets, OR glucose gel. o Recheck blood sugar in 15 minutes after treatment (to make sure it is greater than 70 mg/dL). If your blood sugar is not greater than 70 mg/dL on recheck, call [phone] for further instructions. . Report your blood sugar to the short stay nurse when you get to Short Stay.  . If you are admitted to the hospital after surgery: o Your blood sugar will be checked by the staff and you will probably be given insulin after surgery (instead of oral diabetes medicines) to make sure you have good blood sugar levels. o The goal for blood sugar control after surgery is 80-180 mg/dL.   WHAT DO I DO ABOUT MY DIABETES MEDICATION?  Marland Kitchen Do not take oral diabetes medicines (pills) the  morning of surgery.  . THE DAY BEFORE SURGERY, take Metformin as usual. DO NOT take Jardiance.      . THE MORNING OF SURGERY, DO NOT take any diabetes medicine.  DO NOT TAKE ANY DIABETIC MEDICATIONS DAY OF YOUR SURGERY                               You may not have any metal on your body including hair pins and              piercings  Do not wear jewelry, make-up, lotions, powders or perfumes, deodorant             Do not wear nail polish on your fingernails.  Do not shave  48 hours prior to surgery.                Do not bring valuables to the hospital. [facility] IS NOT             RESPONSIBLE   FOR VALUABLES.  Contacts, dentures or bridgework may not be worn into surgery.  Leave  suitcase in the car. After surgery it may be brought to your room.     Patients discharged the day of surgery will not be allowed to drive home. IF YOU ARE HAVING SURGERY AND GOING HOME THE SAME DAY, YOU MUST HAVE AN ADULT TO DRIVE YOU HOME AND BE WITH YOU FOR 24 HOURS. YOU MAY GO HOME BY TAXI OR UBER OR ORTHERWISE, BUT AN ADULT MUST ACCOMPANY YOU HOME AND STAY WITH YOU FOR 24 HOURS.  Name and phone number of your driver:  Special Instructions: N/A              Please read over the following fact sheets you were given: ____________________________________________________________________   [name] MAY BRING A SMALL OVERNIGHT BAG         [facility] - Preparing for Surgery Before surgery, you can play an important role.  Because skin is not sterile, your skin needs to be as free of germs as possible.  You can reduce the number of germs on your skin by washing with CHG (chlorahexidine gluconate) soap before surgery.  CHG is an antiseptic cleaner which kills germs and bonds with the skin to continue killing germs even after washing. Please DO NOT use if you have an allergy to CHG or antibacterial soaps.  If your skin becomes reddened/irritated stop using the CHG and inform your nurse when you arrive at Short Stay. Do not shave (including legs and underarms) for at least 48 hours prior to the first CHG shower.  You may shave your face/neck. Please follow these instructions carefully:  1.  Shower with CHG Soap the night before surgery and the  morning of Surgery.  2.  If you choose to wash your hair, wash your hair first as usual with your  normal  shampoo.  3.  After you shampoo, rinse your hair and body thoroughly to remove the  shampoo.                           4.  Use CHG as you would any other liquid soap.  You can apply chg directly  to the skin and wash                       Gently with a scrungie or  clean washcloth.  5.  Apply the CHG Soap to your body ONLY FROM THE NECK DOWN.   Do not use on  face/ open                           Wound or open sores. Avoid contact with eyes, ears mouth and genitals (private parts).                       Wash face,  Genitals (private parts) with your normal soap.             6.  Wash thoroughly, paying special attention to the area where your surgery  will be performed.  7.  Thoroughly rinse your body with warm water from the neck down.  8.  DO NOT shower/wash with your normal soap after using and rinsing off  the CHG Soap.                9.  Pat yourself dry with a clean towel.            10.  Wear clean pajamas.            11.  Place clean sheets on your bed the night of your first shower and do not  sleep with pets. Day of Surgery : Do not apply any lotions/deodorants the morning of surgery.  Please wear clean clothes to the hospital/surgery center.  FAILURE TO FOLLOW THESE INSTRUCTIONS MAY RESULT IN THE CANCELLATION OF YOUR SURGERY PATIENT SIGNATURE_________________________________  NURSE SIGNATURE__________________________________  ________________________________________________________________________

## 2020-08-12 ENCOUNTER — Other Ambulatory Visit (HOSPITAL_COMMUNITY): Payer: Medicare PPO

## 2020-08-12 ENCOUNTER — Ambulatory Visit: Payer: Self-pay | Admitting: Student

## 2020-08-12 ENCOUNTER — Encounter (HOSPITAL_COMMUNITY)
Admission: RE | Admit: 2020-08-12 | Discharge: 2020-08-12 | Disposition: A | Discharge status: Home / Self Care | Payer: Medicare PPO | Source: Ambulatory Visit | Attending: Orthopedic Surgery | Admitting: Orthopedic Surgery

## 2020-08-12 DIAGNOSIS — L400 Psoriasis vulgaris: Secondary | ICD-10-CM | POA: Diagnosis not present

## 2020-08-14 DIAGNOSIS — G629 Polyneuropathy, unspecified: Secondary | ICD-10-CM | POA: Diagnosis not present

## 2020-08-21 ENCOUNTER — Ambulatory Visit: Payer: Medicare PPO | Admitting: Orthopaedic Surgery

## 2020-08-21 VITALS — Ht 62.0 in | Wt 181.0 lb

## 2020-08-21 DIAGNOSIS — M170 Bilateral primary osteoarthritis of knee: Secondary | ICD-10-CM

## 2020-08-21 DIAGNOSIS — M1711 Unilateral primary osteoarthritis, right knee: Secondary | ICD-10-CM | POA: Insufficient documentation

## 2020-08-21 DIAGNOSIS — M1712 Unilateral primary osteoarthritis, left knee: Secondary | ICD-10-CM

## 2020-08-21 NOTE — Progress Notes (Signed)
Office Visit Note   Patient: April Jones           Date of Birth: 09-15-1946           MRN: [id number] Visit Date: 08/21/2020              Requested by: Darrow Bussing, MD [address] [place],  [place] [address] PCP: Darrow Bussing, MD   Assessment & Plan: Visit Diagnoses:  1. Unilateral primary osteoarthritis, left knee   2. Unilateral primary osteoarthritis, right knee     Plan: I did show her a knee model and explained to her in detail what knee replacement surgery involves. She would like to have this done with me knowing that she will need to stay overnight. Also she will likely need skilled nursing after this. She understands that right now we are on hold with having surgeries that are performed as an inpatient until we are allowed to do so once bed availability is at a point to where these types of surgeries can be scheduled. Right now due to the COVID-19 pandemic, we are on hold with outpatient surgery is only being performed. All question concerns were answered and addressed. We will work on getting her scheduled once we are allowed to do so. Follow-Up Instructions: Return for 2 weeks post-op.   Orders:  No orders of the defined types were placed in this encounter.  No orders of the defined types were placed in this encounter.     Procedures: No procedures performed   Clinical Data: No additional findings.   Subjective: Chief Complaint  Patient presents with  . Right Knee - Pain  The patient comes in for second opinion as it relates to her right knee pain and known osteoarthritis of both knees. She is actually scheduled by one of my colleagues and found to have a right knee replaced next month. I have actually operated on several family members of hers so she came to me for second opinion and states that she would like me to take care of her knee. She understands that there is delays in surgery due to the Covid pandemic and that they are  only allowing same-day total joint surgery right now. She is someone that we will need to stay overnight and maybe a day or 2 because she will need short-term skilled nursing for rehabilitation after this and this is what she has described to me what her wishes are. Currently her knee pain is daily and is severe. It is 10 out of 10 and it is detrimentally affecting her mobility, her quality of life and her actives daily living. She ambulates with a rolling walker. She has tried failed all forms of conservative treatment including activity modification, quad strengthening exercises, steroid injections and hyaluronic acid for both knees. Her right knee hurts much worse than the left knee. She says she cannot straighten out her right knee. Again she is already been scheduled for right knee replacement in October. She denies any acute change in her medical status  HPI  Review of Systems She currently denies any headache, chest pain, shortness of breath, fever, chills, nausea, vomiting  Objective: Vital Signs: Ht 5\' 2"  (1.575 m)   Wt 181 lb (82.1 kg)   LMP  (LMP Unknown)   BMI 33.11 kg/m   Physical Exam She is alert and orient x3 and in no acute distress Ortho Exam Examination of her right knee shows a 5 degree flexion contracture. There  is slight valgus malalignment with medial and lateral joint line tenderness. There is limitations in flexion and extension of the right knee. The left knee can fully extend and there is global tenderness more so on the medial joint line with some slight varus malalignment. Specialty Comments:  No specialty comments available.  Imaging: No results found. X-rays that accompany her that are independent reviewed show severe end-stage arthritis involving all 3 compartments of both knees. There is also joint space and osteophytes throughout all 3 compartments as well as sclerotic changes. The left knee has slight varus malalignment and the right knee has slight valgus  malalignment.  PMFS History: Patient Active Problem List   Diagnosis Date Noted  . Unilateral primary osteoarthritis, left knee 08/21/2020  . Unilateral primary osteoarthritis, right knee 08/21/2020   No past medical history on file.  Family History  Problem Relation Age of Onset  . Breast cancer Neg Hx     Past Surgical History:  Procedure Laterality Date  . BREAST BIOPSY Right 1978  . BREAST EXCISIONAL BIOPSY Right    Social History   Occupational History  . Not on file  Tobacco Use  . Smoking status: Never Smoker  . Smokeless tobacco: Never Used  Substance and Sexual Activity  . Alcohol use: No  . Drug use: No  . Sexual activity: Not on file

## 2020-09-04 DIAGNOSIS — E669 Obesity, unspecified: Secondary | ICD-10-CM | POA: Diagnosis not present

## 2020-09-04 DIAGNOSIS — T7840XD Allergy, unspecified, subsequent encounter: Secondary | ICD-10-CM | POA: Diagnosis not present

## 2020-09-04 DIAGNOSIS — Z9181 History of falling: Secondary | ICD-10-CM | POA: Diagnosis not present

## 2020-09-04 DIAGNOSIS — K5909 Other constipation: Secondary | ICD-10-CM | POA: Diagnosis not present

## 2020-09-04 DIAGNOSIS — E1142 Type 2 diabetes mellitus with diabetic polyneuropathy: Secondary | ICD-10-CM | POA: Diagnosis not present

## 2020-09-04 DIAGNOSIS — K219 Gastro-esophageal reflux disease without esophagitis: Secondary | ICD-10-CM | POA: Diagnosis not present

## 2020-09-04 DIAGNOSIS — G8929 Other chronic pain: Secondary | ICD-10-CM | POA: Diagnosis not present

## 2020-09-04 DIAGNOSIS — I10 Essential (primary) hypertension: Secondary | ICD-10-CM | POA: Diagnosis not present

## 2020-09-04 DIAGNOSIS — Z833 Family history of diabetes mellitus: Secondary | ICD-10-CM | POA: Diagnosis not present

## 2020-09-04 DIAGNOSIS — H269 Unspecified cataract: Secondary | ICD-10-CM | POA: Diagnosis not present

## 2020-09-04 DIAGNOSIS — Z809 Family history of malignant neoplasm, unspecified: Secondary | ICD-10-CM | POA: Diagnosis not present

## 2020-09-04 DIAGNOSIS — E785 Hyperlipidemia, unspecified: Secondary | ICD-10-CM | POA: Diagnosis not present

## 2020-09-04 DIAGNOSIS — Z8249 Family history of ischemic heart disease and other diseases of the circulatory system: Secondary | ICD-10-CM | POA: Diagnosis not present

## 2020-09-04 DIAGNOSIS — Z96642 Presence of left artificial hip joint: Secondary | ICD-10-CM | POA: Diagnosis not present

## 2020-09-11 NOTE — Progress Notes (Addendum)
DUE TO COVID-19 ONLY ONE VISITOR IS ALLOWED TO COME WITH YOU AND STAY IN THE WAITING ROOM ONLY DURING PRE OP AND PROCEDURE DAY OF SURGERY. THE 1 VISITOR  MAY VISIT WITH YOU AFTER SURGERY IN YOUR PRIVATE ROOM DURING VISITING HOURS ONLY!  YOU NEED TO HAVE A COVID 19 TEST ON___10/08/2020 ____ @_______ , THIS TEST MUST BE DONE BEFORE SURGERY,  COVID TESTING SITE [address] [place] , IT IS ON THE RIGHT GOING OUT [address] APPROXIMATELY  2 MINUTES PAST [employer] ON THE RIGHT. ONCE YOUR COVID TEST IS COMPLETED,  PLEASE BEGIN THE QUARANTINE INSTRUCTIONS AS OUTLINED IN YOUR HANDOUT.                April Jones  09/11/2020   Your procedure is scheduled on: 09/25/2020   Report to [facility] Main  Entrance   Report to admitting at     0600 AM     Call this number if you have problems the morning of surgery [phone]    REMEMBER: NO  SOLID FOOD CANDY OR GUM AFTER MIDNIGHT. CLEAR LIQUIDS UNTIL    0530am        . NOTHING BY MOUTH EXCEPT CLEAR LIQUIDS UNTIL    . PLEASE FINISH ENSURE DRINK PER SURGEON ORDER  WHICH NEEDS TO BE COMPLETED AT  0530am    .      CLEAR LIQUID DIET   Foods Allowed                                                                    Coffee and tea, regular and decaf                            Fruit ices (not with fruit pulp)                                      Iced Popsicles                                    Carbonated beverages, regular and diet                                    Cranberry, grape and apple juices Sports drinks like Gatorade Lightly seasoned clear broth or consume(fat free) Sugar, honey syrup ___________________________________________________________________      BRUSH YOUR TEETH MORNING OF SURGERY AND RINSE YOUR MOUTH OUT, NO CHEWING GUM CANDY OR MINTS.     Take these medicines the morning of surgery with A SIP OF WATER: eye drops as usual omeprazole, topr9l, claritin, amlodipine, cymbalta,  gabapentin  Hold jardiance day before surgery  DO NOT TAKE ANY DIABETIC MEDICATIONS DAY OF YOUR SURGERY                               You may not have any metal on your body including hair pins and  piercings  Do not wear jewelry, make-up, lotions, powders or perfumes, deodorant             Do not wear nail polish on your fingernails.  Do not shave  48 hours prior to surgery.              Men may shave face and neck.   Do not bring valuables to the hospital. [facility].  Contacts, dentures or bridgework may not be worn into surgery.  Leave suitcase in the car. After surgery it may be brought to your room.     Patients discharged the day of surgery will not be allowed to drive home. IF YOU ARE HAVING SURGERY AND GOING HOME THE SAME DAY, YOU MUST HAVE AN ADULT TO DRIVE YOU HOME AND BE WITH YOU FOR 24 HOURS. YOU MAY GO HOME BY TAXI OR UBER OR ORTHERWISE, BUT AN ADULT MUST ACCOMPANY YOU HOME AND STAY WITH YOU FOR 24 HOURS.  Name and phone number of your driver:  Special Instructions: N/A              Please read over the following fact sheets you were given: _____________________________________________________________________  [facility] - Preparing for Surgery Before surgery, you can play an important role.  Because skin is not sterile, your skin needs to be as free of germs as possible.  You can reduce the number of germs on your skin by washing with CHG (chlorahexidine gluconate) soap before surgery.  CHG is an antiseptic cleaner which kills germs and bonds with the skin to continue killing germs even after washing. Please DO NOT use if you have an allergy to CHG or antibacterial soaps.  If your skin becomes reddened/irritated stop using the CHG and inform your nurse when you arrive at Short Stay. Do not shave (including legs and underarms) for at least 48 hours prior to the first CHG shower.  You may shave your face/neck. Please  follow these instructions carefully:  1.  Shower with CHG Soap the night before surgery and the  morning of Surgery.  2.  If you choose to wash your hair, wash your hair first as usual with your  normal  shampoo.  3.  After you shampoo, rinse your hair and body thoroughly to remove the  shampoo.                           4.  Use CHG as you would any other liquid soap.  You can apply chg directly  to the skin and wash                       Gently with a scrungie or clean washcloth.  5.  Apply the CHG Soap to your body ONLY FROM THE NECK DOWN.   Do not use on face/ open                           Wound or open sores. Avoid contact with eyes, ears mouth and genitals (private parts).                       Wash face,  Genitals (private parts) with your normal soap.             6.  Wash thoroughly, paying special attention to the area where your surgery  will be performed.  7.  Thoroughly rinse your body with warm water from the neck down.  8.  DO NOT shower/wash with your normal soap after using and rinsing off  the CHG Soap.                9.  Pat yourself dry with a clean towel.            10.  Wear clean pajamas.            11.  Place clean sheets on your bed the night of your first shower and do not  sleep with pets. Day of Surgery : Do not apply any lotions/deodorants the morning of surgery.  Please wear clean clothes to the hospital/surgery center.  FAILURE TO FOLLOW THESE INSTRUCTIONS MAY RESULT IN THE CANCELLATION OF YOUR SURGERY PATIENT SIGNATURE_________________________________  NURSE SIGNATURE__________________________________  ________________________________________________________________________

## 2020-09-12 ENCOUNTER — Encounter (HOSPITAL_COMMUNITY): Payer: Self-pay

## 2020-09-12 ENCOUNTER — Encounter (HOSPITAL_COMMUNITY)
Admission: RE | Admit: 2020-09-12 | Discharge: 2020-09-12 | Disposition: A | Discharge status: Home / Self Care | Payer: Medicare PPO | Source: Ambulatory Visit | Attending: Orthopedic Surgery | Admitting: Orthopedic Surgery

## 2020-09-12 ENCOUNTER — Other Ambulatory Visit: Payer: Self-pay

## 2020-09-12 DIAGNOSIS — Z01812 Encounter for preprocedural laboratory examination: Secondary | ICD-10-CM | POA: Diagnosis not present

## 2020-09-12 DIAGNOSIS — L900 Lichen sclerosus et atrophicus: Secondary | ICD-10-CM | POA: Diagnosis not present

## 2020-09-12 HISTORY — DX: Type 2 diabetes mellitus without complications: E11.9

## 2020-09-12 HISTORY — DX: Unspecified osteoarthritis, unspecified site: M19.90

## 2020-09-12 HISTORY — DX: Essential (primary) hypertension: I10

## 2020-09-12 LAB — CBC
HCT: 41 % (ref 36.0–46.0)
Hemoglobin: 12.9 g/dL (ref 12.0–15.0)
MCH: 29.9 pg (ref 26.0–34.0)
MCHC: 31.5 g/dL (ref 30.0–36.0)
MCV: 94.9 fL (ref 80.0–100.0)
Platelets: 459 10*3/uL — ABNORMAL HIGH (ref 150–400)
RBC: 4.32 MIL/uL (ref 3.87–5.11)
RDW: 14.6 % (ref 11.5–15.5)
WBC: 6 10*3/uL (ref 4.0–10.5)
nRBC: 0 % (ref 0.0–0.2)

## 2020-09-12 LAB — COMPREHENSIVE METABOLIC PANEL
ALT: 8 U/L (ref 0–44)
AST: 17 U/L (ref 15–41)
Albumin: 3.6 g/dL (ref 3.5–5.0)
Alkaline Phosphatase: 74 U/L (ref 38–126)
Anion gap: 10 (ref 5–15)
BUN: 7 mg/dL — ABNORMAL LOW (ref 8–23)
CO2: 26 mmol/L (ref 22–32)
Calcium: 9.4 mg/dL (ref 8.9–10.3)
Chloride: 101 mmol/L (ref 98–111)
Creatinine, Ser: 0.93 mg/dL (ref 0.44–1.00)
GFR calc Af Amer: >60 mL/min (ref >60)
GFR calc non Af Amer: >60 mL/min (ref >60)
Glucose, Bld: 147 mg/dL — ABNORMAL HIGH (ref 70–99)
Potassium: 4.7 mmol/L (ref 3.5–5.1)
Sodium: 137 mmol/L (ref 135–145)
Total Bilirubin: 0.4 mg/dL (ref 0.3–1.2)
Total Protein: 7.5 g/dL (ref 6.5–8.1)

## 2020-09-12 LAB — URINALYSIS, ROUTINE W REFLEX MICROSCOPIC
Bacteria, UA: NONE SEEN
Bilirubin Urine: NEGATIVE
Glucose, UA: >=500 mg/dL — AB
Hgb urine dipstick: NEGATIVE
Ketones, ur: NEGATIVE mg/dL
Leukocytes,Ua: NEGATIVE
Nitrite: NEGATIVE
Protein, ur: NEGATIVE mg/dL
Specific Gravity, Urine: 1.017 (ref 1.005–1.030)
pH: 7 (ref 5.0–8.0)

## 2020-09-12 LAB — TYPE AND SCREEN
ABO/RH(D): A POS
Antibody Screen: NEGATIVE

## 2020-09-12 LAB — SURGICAL PCR SCREEN
MRSA, PCR: NEGATIVE
Staphylococcus aureus: NEGATIVE

## 2020-09-12 LAB — PROTIME-INR
INR: 0.9 (ref 0.8–1.2)
Prothrombin Time: 11.8 seconds (ref 11.4–15.2)

## 2020-09-12 LAB — GLUCOSE, CAPILLARY: Glucose-Capillary: 149 mg/dL — ABNORMAL HIGH (ref 70–99)

## 2020-09-12 NOTE — Progress Notes (Signed)
Called and requested most recent office visit note, 12 leak ekg tracing and HGBA1c result.  [facility] for Medical Records.

## 2020-09-12 NOTE — Progress Notes (Signed)
U/A and CMp done 09/12/20 faxed via epic to Dr Linna Caprice.

## 2020-09-13 DIAGNOSIS — Z79899 Other long term (current) drug therapy: Secondary | ICD-10-CM | POA: Diagnosis not present

## 2020-09-13 DIAGNOSIS — E2839 Other primary ovarian failure: Secondary | ICD-10-CM | POA: Diagnosis not present

## 2020-09-13 DIAGNOSIS — Z0001 Encounter for general adult medical examination with abnormal findings: Secondary | ICD-10-CM | POA: Diagnosis not present

## 2020-09-13 DIAGNOSIS — M1711 Unilateral primary osteoarthritis, right knee: Secondary | ICD-10-CM | POA: Diagnosis not present

## 2020-09-13 DIAGNOSIS — G629 Polyneuropathy, unspecified: Secondary | ICD-10-CM | POA: Diagnosis not present

## 2020-09-13 DIAGNOSIS — E7800 Pure hypercholesterolemia, unspecified: Secondary | ICD-10-CM | POA: Diagnosis not present

## 2020-09-13 DIAGNOSIS — M19049 Primary osteoarthritis, unspecified hand: Secondary | ICD-10-CM | POA: Diagnosis not present

## 2020-09-13 NOTE — Progress Notes (Signed)
Rerquested most recent OV note, ekg and hgba1c on pt from [place] AT [facility].

## 2020-09-16 ENCOUNTER — Other Ambulatory Visit (HOSPITAL_COMMUNITY): Payer: Self-pay | Admitting: *Deleted

## 2020-09-16 ENCOUNTER — Ambulatory Visit: Payer: Self-pay | Admitting: Student

## 2020-09-16 DIAGNOSIS — M1711 Unilateral primary osteoarthritis, right knee: Secondary | ICD-10-CM | POA: Diagnosis not present

## 2020-09-16 NOTE — H&P (Signed)
TOTAL KNEE ADMISSION H&P  Patient is being admitted for right total knee arthroplasty.  Subjective:  Chief Complaint:right knee pain.  HPI: April Jones, 74 y.o. female, has a history of pain and functional disability in the right knee due to arthritis and has failed non-surgical conservative treatments for greater than 12 weeks to includeNSAID's and/or analgesics, corticosteriod injections and activity modification.  Onset of symptoms was gradual, starting 3 years ago with gradually worsening course since that time. The patient noted no past surgery on the right knee(s).  Patient currently rates pain in the right knee(s) at 5 out of 10 with activity. Patient has worsening of pain with activity and weight bearing, pain that interferes with activities of daily living and pain with passive range of motion.  Patient has evidence of joint space narrowing by imaging studies.  There is no active infection.  Patient Active Problem List   Diagnosis Date Noted  . Unilateral primary osteoarthritis, left knee 08/21/2020  . Unilateral primary osteoarthritis, right knee 08/21/2020   Past Medical History:  Diagnosis Date  . Arthritis   . Diabetes mellitus without complication (HCC)    type 2   . Hypertension     Past Surgical History:  Procedure Laterality Date  . BREAST BIOPSY Right 1978  . BREAST EXCISIONAL BIOPSY Right   . ganglion cysts removal     . left hip replacement    . right thigh surgery     . TONSILLECTOMY      Current Outpatient Medications  Medication Sig Dispense Refill Last Dose  . ACCU-CHEK AVIVA PLUS test strip USE TO CHECK BLOOD SUGARS BID  3   . amLODipine (NORVASC) 5 MG tablet Take 5 mg by mouth daily.      Marland Kitchen atorvastatin (LIPITOR) 10 MG tablet Take 10 mg by mouth daily.   5   . calcipotriene (DOVONOX) 0.005 % ointment Apply 1 application topically 2 (two) times daily as needed (psoriasis).      . celecoxib (CELEBREX) 100 MG capsule Take 100 mg by mouth daily.      . cholecalciferol (VITAMIN D) 25 MCG (1000 UNIT) tablet Take 1,000 Units by mouth daily.     . clobetasol ointment (TEMOVATE) 0.05 % Apply 1 application topically 2 (two) times daily as needed (psoriasis).   2   . DULoxetine (CYMBALTA) 60 MG capsule Take 60 mg by mouth in the morning and at bedtime.      . furosemide (LASIX) 40 MG tablet Take 40 mg by mouth daily.   1   . gabapentin (NEURONTIN) 300 MG capsule TAKE 2 CAPSULES EVERY MORNING, 2 CAPSULES AT LUNCH, 3 CAPSULES AT BEDTIME (Patient taking differently: Take 300 mg by mouth in the morning and at bedtime. ) 630 capsule 2   . gabapentin (NEURONTIN) 400 MG capsule Take 400 mg by mouth at bedtime.     Marland Kitchen JARDIANCE 25 MG TABS tablet Take 25 mg by mouth daily.   5   . loratadine (CLARITIN) 10 MG tablet Take 10 mg by mouth daily.   1   . Magnesium 400 MG TABS Take 400 mg by mouth daily. Bone Health     . metFORMIN (GLUCOPHAGE) 1000 MG tablet Take 1,000 mg by mouth in the morning and at bedtime. Morning & Afternoon  1   . metoprolol succinate (TOPROL-XL) 100 MG 24 hr tablet Take 100 mg by mouth daily.   1   . omeprazole (PRILOSEC) 20 MG capsule Take 20 mg by mouth  daily.   1   . OZEMPIC, 1 MG/DOSE, 4 MG/3ML SOPN Inject 1 mg into the skin every Thursday.     . polyethylene glycol powder (GLYCOLAX/MIRALAX) powder Take 17 g by mouth at bedtime.   10   . vitamin B-12 (CYANOCOBALAMIN) 1000 MCG tablet Take 1,000 mcg by mouth daily.     Marland Kitchen XIIDRA 5 % SOLN Place 1 drop into both eyes in the morning and at bedtime.   11    No current facility-administered medications for this visit.   No Known Allergies  Social History   Tobacco Use  . Smoking status: Never Smoker  . Smokeless tobacco: Never Used  Substance Use Topics  . Alcohol use: No    Family History  Problem Relation Age of Onset  . Breast cancer Neg Hx      Review of Systems  Constitutional: Negative.   HENT: Negative.   Eyes: Negative.   Respiratory: Negative.   Cardiovascular:  Negative.   Gastrointestinal: Positive for constipation.  Endocrine: Negative.   Genitourinary: Negative.   Musculoskeletal: Positive for arthralgias.  Skin: Positive for rash.  Allergic/Immunologic: Negative.   Neurological: Negative.   Hematological: Negative.   Psychiatric/Behavioral: Negative.     Objective:  Physical Exam HENT:     Head: Normocephalic.  Eyes:     Pupils: Pupils are equal, round, and reactive to light.  Cardiovascular:     Rate and Rhythm: Normal rate and regular rhythm.     Heart sounds: Normal heart sounds.  Pulmonary:     Effort: Pulmonary effort is normal.     Breath sounds: Normal breath sounds.  Abdominal:     Palpations: Abdomen is soft.     Tenderness: There is no abdominal tenderness.  Genitourinary:    Comments: Deferred Musculoskeletal:     Cervical back: Normal range of motion.     Comments: Examination the left knee reveals no skin wounds or lesions. Mild valgus deformity. No sniffing tenderness to palpation. Range of motion 10-110. No instability. Painless range of motion of the hip.  Examination the right knee reveals no skin wounds or lesions. No psoriasis actively over the anterior knee. She has a significant varus deformity. She has swelling, trace effusion. No warmth or erythema. Tenderness to palpation medial joint line, lateral joint line, peripatellar retinacular tissues with positive grind sign. Range of motion 24-105. She does have significant varus/valgus laxity. Quad strength is 4/5. Painless range of motion of the hip.  Skin:    General: Skin is warm and dry.  Neurological:     Mental Status: She is alert and oriented to person, place, and time.  Psychiatric:        Mood and Affect: Mood normal.     Vital signs in last 24 hours: @VSRANGES @  Labs:   Estimated body mass index is 32.92 kg/m as calculated from the following:   Height as of 09/12/20: 5\' 2"  (1.575 m).   Weight as of 09/12/20: 81.6 kg.   Imaging  Review Plain radiographs demonstrate severe degenerative joint disease of the right knee(s). The overall alignment ismild varus. The bone quality appears to be adequate for age and reported activity level.      Assessment/Plan:  End stage arthritis, right knee   The patient history, physical examination, clinical judgment of the provider and imaging studies are consistent with end stage degenerative joint disease of the right knee(s) and total knee arthroplasty is deemed medically necessary. The treatment options including medical management, injection  therapy arthroscopy and arthroplasty were discussed at length. The risks and benefits of total knee arthroplasty were presented and reviewed. The risks due to aseptic loosening, infection, stiffness, patella tracking problems, thromboembolic complications and other imponderables were discussed. The patient acknowledged the explanation, agreed to proceed with the plan and consent was signed. Patient is being admitted for inpatient treatment for surgery, pain control, PT, OT, prophylactic antibiotics, VTE prophylaxis, progressive ambulation and ADL's and discharge planning. The patient is planning to be discharged home with her daughter.     Patient's anticipated LOS is less than 2 midnights, meeting these requirements: - Lives within 1 hour of care - Has a competent adult at home to recover with post-op recover - NO history of  - Chronic pain requiring opiods  - Diabetes  - Coronary Artery Disease  - Heart failure  - Heart attack  - Stroke  - DVT/VTE  - Cardiac arrhythmia  - Respiratory Failure/COPD  - Renal failure  - Anemia  - Advanced Liver disease

## 2020-09-16 NOTE — Progress Notes (Signed)
lov dr Veverly Fells 08-14-20 on chart ekg 06-14-20 dr Veverly Fells on chart No hemaglobin a1c received with labs ordered for day of surgery

## 2020-09-16 NOTE — H&P (View-Only) (Signed)
TOTAL KNEE ADMISSION H&P  Patient is being admitted for right total knee arthroplasty.  Subjective:  Chief Complaint:right knee pain.  HPI: April Jones, 74 y.o. female, has a history of pain and functional disability in the right knee due to arthritis and has failed non-surgical conservative treatments for greater than 12 weeks to includeNSAID's and/or analgesics, corticosteriod injections and activity modification.  Onset of symptoms was gradual, starting 3 years ago with gradually worsening course since that time. The patient noted no past surgery on the right knee(s).  Patient currently rates pain in the right knee(s) at 5 out of 10 with activity. Patient has worsening of pain with activity and weight bearing, pain that interferes with activities of daily living and pain with passive range of motion.  Patient has evidence of joint space narrowing by imaging studies.  There is no active infection.  Patient Active Problem List   Diagnosis Date Noted  . Unilateral primary osteoarthritis, left knee 08/21/2020  . Unilateral primary osteoarthritis, right knee 08/21/2020   Past Medical History:  Diagnosis Date  . Arthritis   . Diabetes mellitus without complication (HCC)    type 2   . Hypertension     Past Surgical History:  Procedure Laterality Date  . BREAST BIOPSY Right 1978  . BREAST EXCISIONAL BIOPSY Right   . ganglion cysts removal     . left hip replacement    . right thigh surgery     . TONSILLECTOMY      Current Outpatient Medications  Medication Sig Dispense Refill Last Dose  . ACCU-CHEK AVIVA PLUS test strip USE TO CHECK BLOOD SUGARS BID  3   . amLODipine (NORVASC) 5 MG tablet Take 5 mg by mouth daily.      Marland Kitchen atorvastatin (LIPITOR) 10 MG tablet Take 10 mg by mouth daily.   5   . calcipotriene (DOVONOX) 0.005 % ointment Apply 1 application topically 2 (two) times daily as needed (psoriasis).      . celecoxib (CELEBREX) 100 MG capsule Take 100 mg by mouth daily.      . cholecalciferol (VITAMIN D) 25 MCG (1000 UNIT) tablet Take 1,000 Units by mouth daily.     . clobetasol ointment (TEMOVATE) 0.05 % Apply 1 application topically 2 (two) times daily as needed (psoriasis).   2   . DULoxetine (CYMBALTA) 60 MG capsule Take 60 mg by mouth in the morning and at bedtime.      . furosemide (LASIX) 40 MG tablet Take 40 mg by mouth daily.   1   . gabapentin (NEURONTIN) 300 MG capsule TAKE 2 CAPSULES EVERY MORNING, 2 CAPSULES AT LUNCH, 3 CAPSULES AT BEDTIME (Patient taking differently: Take 300 mg by mouth in the morning and at bedtime. ) 630 capsule 2   . gabapentin (NEURONTIN) 400 MG capsule Take 400 mg by mouth at bedtime.     Marland Kitchen JARDIANCE 25 MG TABS tablet Take 25 mg by mouth daily.   5   . loratadine (CLARITIN) 10 MG tablet Take 10 mg by mouth daily.   1   . Magnesium 400 MG TABS Take 400 mg by mouth daily. Bone Health     . metFORMIN (GLUCOPHAGE) 1000 MG tablet Take 1,000 mg by mouth in the morning and at bedtime. Morning & Afternoon  1   . metoprolol succinate (TOPROL-XL) 100 MG 24 hr tablet Take 100 mg by mouth daily.   1   . omeprazole (PRILOSEC) 20 MG capsule Take 20 mg by mouth  daily.   1   . OZEMPIC, 1 MG/DOSE, 4 MG/3ML SOPN Inject 1 mg into the skin every Thursday.     . polyethylene glycol powder (GLYCOLAX/MIRALAX) powder Take 17 g by mouth at bedtime.   10   . vitamin B-12 (CYANOCOBALAMIN) 1000 MCG tablet Take 1,000 mcg by mouth daily.     Marland Kitchen XIIDRA 5 % SOLN Place 1 drop into both eyes in the morning and at bedtime.   11    No current facility-administered medications for this visit.   No Known Allergies  Social History   Tobacco Use  . Smoking status: Never Smoker  . Smokeless tobacco: Never Used  Substance Use Topics  . Alcohol use: No    Family History  Problem Relation Age of Onset  . Breast cancer Neg Hx      Review of Systems  Constitutional: Negative.   HENT: Negative.   Eyes: Negative.   Respiratory: Negative.   Cardiovascular:  Negative.   Gastrointestinal: Positive for constipation.  Endocrine: Negative.   Genitourinary: Negative.   Musculoskeletal: Positive for arthralgias.  Skin: Positive for rash.  Allergic/Immunologic: Negative.   Neurological: Negative.   Hematological: Negative.   Psychiatric/Behavioral: Negative.     Objective:  Physical Exam HENT:     Head: Normocephalic.  Eyes:     Pupils: Pupils are equal, round, and reactive to light.  Cardiovascular:     Rate and Rhythm: Normal rate and regular rhythm.     Heart sounds: Normal heart sounds.  Pulmonary:     Effort: Pulmonary effort is normal.     Breath sounds: Normal breath sounds.  Abdominal:     Palpations: Abdomen is soft.     Tenderness: There is no abdominal tenderness.  Genitourinary:    Comments: Deferred Musculoskeletal:     Cervical back: Normal range of motion.     Comments: Examination the left knee reveals no skin wounds or lesions. Mild valgus deformity. No sniffing tenderness to palpation. Range of motion 10-110. No instability. Painless range of motion of the hip.  Examination the right knee reveals no skin wounds or lesions. No psoriasis actively over the anterior knee. She has a significant varus deformity. She has swelling, trace effusion. No warmth or erythema. Tenderness to palpation medial joint line, lateral joint line, peripatellar retinacular tissues with positive grind sign. Range of motion 24-105. She does have significant varus/valgus laxity. Quad strength is 4/5. Painless range of motion of the hip.  Skin:    General: Skin is warm and dry.  Neurological:     Mental Status: She is alert and oriented to person, place, and time.  Psychiatric:        Mood and Affect: Mood normal.     Vital signs in last 24 hours: @VSRANGES @  Labs:   Estimated body mass index is 32.92 kg/m as calculated from the following:   Height as of 09/12/20: 5\' 2"  (1.575 m).   Weight as of 09/12/20: 81.6 kg.   Imaging  Review Plain radiographs demonstrate severe degenerative joint disease of the right knee(s). The overall alignment ismild varus. The bone quality appears to be adequate for age and reported activity level.      Assessment/Plan:  End stage arthritis, right knee   The patient history, physical examination, clinical judgment of the provider and imaging studies are consistent with end stage degenerative joint disease of the right knee(s) and total knee arthroplasty is deemed medically necessary. The treatment options including medical management, injection  therapy arthroscopy and arthroplasty were discussed at length. The risks and benefits of total knee arthroplasty were presented and reviewed. The risks due to aseptic loosening, infection, stiffness, patella tracking problems, thromboembolic complications and other imponderables were discussed. The patient acknowledged the explanation, agreed to proceed with the plan and consent was signed. Patient is being admitted for inpatient treatment for surgery, pain control, PT, OT, prophylactic antibiotics, VTE prophylaxis, progressive ambulation and ADL's and discharge planning. The patient is planning to be discharged home with her daughter.     Patient's anticipated LOS is less than 2 midnights, meeting these requirements: - Lives within 1 hour of care - Has a competent adult at home to recover with post-op recover - NO history of  - Chronic pain requiring opiods  - Diabetes  - Coronary Artery Disease  - Heart failure  - Heart attack  - Stroke  - DVT/VTE  - Cardiac arrhythmia  - Respiratory Failure/COPD  - Renal failure  - Anemia  - Advanced Liver disease

## 2020-09-21 ENCOUNTER — Other Ambulatory Visit (HOSPITAL_COMMUNITY)
Admission: RE | Admit: 2020-09-21 | Discharge: 2020-09-21 | Disposition: A | Discharge status: Home / Self Care | Payer: Medicare PPO | Source: Ambulatory Visit | Attending: Orthopedic Surgery | Admitting: Orthopedic Surgery

## 2020-09-21 DIAGNOSIS — Z20822 Contact with and (suspected) exposure to covid-19: Secondary | ICD-10-CM | POA: Insufficient documentation

## 2020-09-21 DIAGNOSIS — Z01812 Encounter for preprocedural laboratory examination: Secondary | ICD-10-CM | POA: Diagnosis not present

## 2020-09-21 LAB — SARS CORONAVIRUS 2 (TAT 6-24 HRS): SARS Coronavirus 2: NEGATIVE

## 2020-09-23 ENCOUNTER — Other Ambulatory Visit: Payer: Self-pay | Admitting: Family Medicine

## 2020-09-23 DIAGNOSIS — Z1231 Encounter for screening mammogram for malignant neoplasm of breast: Secondary | ICD-10-CM

## 2020-09-23 DIAGNOSIS — E2839 Other primary ovarian failure: Secondary | ICD-10-CM

## 2020-09-24 ENCOUNTER — Encounter (HOSPITAL_COMMUNITY): Payer: Self-pay | Admitting: Orthopedic Surgery

## 2020-09-24 NOTE — Anesthesia Preprocedure Evaluation (Addendum)
Anesthesia Evaluation  Patient identified by MRN, date of birth, ID band  Reviewed: Allergy & Precautions  Airway Mallampati: II  TM Distance: >3 FB Neck ROM: Full    Dental no notable dental hx.    Pulmonary neg pulmonary ROS,    Pulmonary exam normal breath sounds clear to auscultation       Cardiovascular Exercise Tolerance: Good hypertension, Normal cardiovascular exam Rhythm:Regular Rate:Normal     Neuro/Psych negative neurological ROS  negative psych ROS   GI/Hepatic negative GI ROS, Neg liver ROS,   Endo/Other  diabetes, Well Controlled, Type 2, Oral Hypoglycemic Agents  Renal/GU negative Renal ROS  negative genitourinary   Musculoskeletal  (+) Arthritis ,   Abdominal Normal abdominal exam  (+)   Peds negative pediatric ROS (+)  Hematology negative hematology ROS (+)   Anesthesia Other Findings   Reproductive/Obstetrics                            Anesthesia Physical Anesthesia Plan  ASA: III  Anesthesia Plan: Regional and Spinal   Post-op Pain Management:  Regional for Post-op pain   Induction: Intravenous  PONV Risk Score and Plan: 2 and Propofol infusion, Treatment may vary due to age or medical condition and TIVA  Airway Management Planned: Natural Airway and Simple Face Mask  Additional Equipment:   Intra-op Plan:   Post-operative Plan:   Informed Consent: I have reviewed the patients History and Physical, chart, labs and discussed the procedure including the risks, benefits and alternatives for the proposed anesthesia with the patient or authorized representative who has indicated his/her understanding and acceptance.     Dental advisory given  Plan Discussed with:   Anesthesia Plan Comments: (EKG reviewed. Plan for adductor canal block for postop pain control. Spinal. GA/LMA as backup plan. Tanna Furry, MD  )       Anesthesia Quick Evaluation

## 2020-09-25 ENCOUNTER — Other Ambulatory Visit: Payer: Self-pay

## 2020-09-25 ENCOUNTER — Ambulatory Visit (HOSPITAL_COMMUNITY): Payer: Medicare PPO

## 2020-09-25 ENCOUNTER — Ambulatory Visit (HOSPITAL_COMMUNITY): Payer: Medicare PPO | Admitting: Anesthesiology

## 2020-09-25 ENCOUNTER — Encounter (HOSPITAL_COMMUNITY)
Admission: RE | Disposition: A | Discharge status: Home / Self Care | Payer: Self-pay | Source: Home / Self Care | Attending: Orthopedic Surgery

## 2020-09-25 ENCOUNTER — Inpatient Hospital Stay (HOSPITAL_COMMUNITY)
Admission: RE | Admit: 2020-09-25 | Discharge: 2020-09-27 | DRG: 470 | Disposition: A | Discharge status: Home / Self Care | Payer: Medicare PPO | Attending: Orthopedic Surgery | Admitting: Orthopedic Surgery

## 2020-09-25 ENCOUNTER — Encounter (HOSPITAL_COMMUNITY): Payer: Self-pay | Admitting: Orthopedic Surgery

## 2020-09-25 DIAGNOSIS — Z96642 Presence of left artificial hip joint: Secondary | ICD-10-CM | POA: Diagnosis present

## 2020-09-25 DIAGNOSIS — I110 Hypertensive heart disease with heart failure: Secondary | ICD-10-CM | POA: Diagnosis present

## 2020-09-25 DIAGNOSIS — I10 Essential (primary) hypertension: Secondary | ICD-10-CM | POA: Diagnosis not present

## 2020-09-25 DIAGNOSIS — M1711 Unilateral primary osteoarthritis, right knee: Secondary | ICD-10-CM | POA: Diagnosis present

## 2020-09-25 DIAGNOSIS — E119 Type 2 diabetes mellitus without complications: Secondary | ICD-10-CM | POA: Diagnosis not present

## 2020-09-25 DIAGNOSIS — Z96651 Presence of right artificial knee joint: Secondary | ICD-10-CM | POA: Diagnosis not present

## 2020-09-25 DIAGNOSIS — Z791 Long term (current) use of non-steroidal anti-inflammatories (NSAID): Secondary | ICD-10-CM | POA: Diagnosis not present

## 2020-09-25 DIAGNOSIS — Z79899 Other long term (current) drug therapy: Secondary | ICD-10-CM | POA: Diagnosis not present

## 2020-09-25 DIAGNOSIS — E1140 Type 2 diabetes mellitus with diabetic neuropathy, unspecified: Secondary | ICD-10-CM | POA: Diagnosis present

## 2020-09-25 DIAGNOSIS — Z7984 Long term (current) use of oral hypoglycemic drugs: Secondary | ICD-10-CM

## 2020-09-25 DIAGNOSIS — G8918 Other acute postprocedural pain: Secondary | ICD-10-CM | POA: Diagnosis not present

## 2020-09-25 DIAGNOSIS — Z471 Aftercare following joint replacement surgery: Secondary | ICD-10-CM | POA: Diagnosis not present

## 2020-09-25 DIAGNOSIS — I509 Heart failure, unspecified: Secondary | ICD-10-CM | POA: Diagnosis present

## 2020-09-25 DIAGNOSIS — M25561 Pain in right knee: Secondary | ICD-10-CM | POA: Diagnosis present

## 2020-09-25 HISTORY — PX: KNEE ARTHROPLASTY: SHX992

## 2020-09-25 LAB — GLUCOSE, CAPILLARY
Glucose-Capillary: 93 mg/dL (ref 70–99)
Glucose-Capillary: 138 mg/dL — ABNORMAL HIGH (ref 70–99)
Glucose-Capillary: 176 mg/dL — ABNORMAL HIGH (ref 70–99)
Glucose-Capillary: 184 mg/dL — ABNORMAL HIGH (ref 70–99)

## 2020-09-25 LAB — ABO/RH: ABO/RH(D): A POS

## 2020-09-25 SURGERY — ARTHROPLASTY, KNEE, TOTAL, USING IMAGELESS COMPUTER-ASSISTED NAVIGATION
Anesthesia: Regional | Site: Knee | Laterality: Right

## 2020-09-25 MED ORDER — MORPHINE SULFATE (PF) 2 MG/ML IV SOLN
0.5000 mg | INTRAVENOUS | Status: DC | PRN
  Administered 2020-09-26: 1 mg via INTRAVENOUS
  Filled 2020-09-25: qty 1

## 2020-09-25 MED ORDER — MAGNESIUM OXIDE 400 (241.3 MG) MG PO TABS
400.0000 mg | ORAL_TABLET | Freq: Every day | ORAL | Status: DC
  Administered 2020-09-25 – 2020-09-27 (×3): 400 mg via ORAL
  Filled 2020-09-25 (×3): qty 1

## 2020-09-25 MED ORDER — EMPAGLIFLOZIN 25 MG PO TABS
25.0000 mg | ORAL_TABLET | Freq: Every day | ORAL | Status: DC
  Administered 2020-09-26 – 2020-09-27 (×2): 25 mg via ORAL
  Filled 2020-09-25 (×2): qty 1

## 2020-09-25 MED ORDER — MIDAZOLAM HCL 2 MG/2ML IJ SOLN
INTRAMUSCULAR | Status: AC
  Filled 2020-09-25: qty 2

## 2020-09-25 MED ORDER — PROPOFOL 500 MG/50ML IV EMUL
INTRAVENOUS | Status: DC | PRN
  Administered 2020-09-25: 50 ug/kg/min via INTRAVENOUS

## 2020-09-25 MED ORDER — BUPIVACAINE-EPINEPHRINE (PF) 0.25% -1:200000 IJ SOLN
INTRAMUSCULAR | Status: AC
  Filled 2020-09-25: qty 30

## 2020-09-25 MED ORDER — PHENYLEPHRINE 40 MCG/ML (10ML) SYRINGE FOR IV PUSH (FOR BLOOD PRESSURE SUPPORT)
PREFILLED_SYRINGE | INTRAVENOUS | Status: DC | PRN
  Administered 2020-09-25 (×5): 80 ug via INTRAVENOUS

## 2020-09-25 MED ORDER — INSULIN ASPART 100 UNIT/ML ~~LOC~~ SOLN: 0.0000 [IU] | Freq: Every day | SUBCUTANEOUS | Status: DC

## 2020-09-25 MED ORDER — POVIDONE-IODINE 10 % EX SWAB
2.0000 "application " | Freq: Once | CUTANEOUS | Status: AC
  Administered 2020-09-25: 2 via TOPICAL

## 2020-09-25 MED ORDER — TRANEXAMIC ACID-NACL 1000-0.7 MG/100ML-% IV SOLN
1000.0000 mg | INTRAVENOUS | Status: AC
  Administered 2020-09-25: 1000 mg via INTRAVENOUS
  Filled 2020-09-25: qty 100

## 2020-09-25 MED ORDER — BUPIVACAINE HCL (PF) 0.5 % IJ SOLN
INTRAMUSCULAR | Status: DC | PRN
  Administered 2020-09-25: 30 mL

## 2020-09-25 MED ORDER — DULOXETINE HCL 60 MG PO CPEP
60.0000 mg | ORAL_CAPSULE | Freq: Two times a day (BID) | ORAL | Status: DC
  Administered 2020-09-25 – 2020-09-27 (×4): 60 mg via ORAL
  Filled 2020-09-25 (×4): qty 1

## 2020-09-25 MED ORDER — DOCUSATE SODIUM 100 MG PO CAPS
100.0000 mg | ORAL_CAPSULE | Freq: Two times a day (BID) | ORAL | Status: DC
  Administered 2020-09-25 – 2020-09-27 (×4): 100 mg via ORAL
  Filled 2020-09-25 (×5): qty 1

## 2020-09-25 MED ORDER — KETOROLAC TROMETHAMINE 30 MG/ML IJ SOLN
INTRAMUSCULAR | Status: DC | PRN
  Administered 2020-09-25: 30 mg via INTRA_ARTICULAR

## 2020-09-25 MED ORDER — FUROSEMIDE 40 MG PO TABS
40.0000 mg | ORAL_TABLET | Freq: Every day | ORAL | Status: DC
  Administered 2020-09-26 – 2020-09-27 (×2): 40 mg via ORAL
  Filled 2020-09-25 (×3): qty 1

## 2020-09-25 MED ORDER — MENTHOL 3 MG MT LOZG: 1.0000 | LOZENGE | OROMUCOSAL | Status: DC | PRN

## 2020-09-25 MED ORDER — MIDAZOLAM HCL 5 MG/5ML IJ SOLN
INTRAMUSCULAR | Status: DC | PRN
  Administered 2020-09-25: 1 mg via INTRAVENOUS

## 2020-09-25 MED ORDER — METOPROLOL SUCCINATE ER 50 MG PO TB24
100.0000 mg | ORAL_TABLET | Freq: Every day | ORAL | Status: DC
  Administered 2020-09-26 – 2020-09-27 (×2): 100 mg via ORAL
  Filled 2020-09-25 (×3): qty 2

## 2020-09-25 MED ORDER — POLYETHYLENE GLYCOL 3350 17 G PO PACK: 17.0000 g | PACK | Freq: Every day | ORAL | Status: DC | PRN

## 2020-09-25 MED ORDER — ORAL CARE MOUTH RINSE: 15.0000 mL | Freq: Once | OROMUCOSAL | Status: AC

## 2020-09-25 MED ORDER — METHOCARBAMOL 500 MG IVPB - SIMPLE MED
500.0000 mg | Freq: Four times a day (QID) | INTRAVENOUS | Status: DC | PRN
  Filled 2020-09-25: qty 50

## 2020-09-25 MED ORDER — ATORVASTATIN CALCIUM 10 MG PO TABS
10.0000 mg | ORAL_TABLET | Freq: Every day | ORAL | Status: DC
  Administered 2020-09-25 – 2020-09-27 (×3): 10 mg via ORAL
  Filled 2020-09-25 (×3): qty 1

## 2020-09-25 MED ORDER — SODIUM CHLORIDE 0.9 % IV SOLN: INTRAVENOUS | Status: DC

## 2020-09-25 MED ORDER — ACETAMINOPHEN 10 MG/ML IV SOLN
1000.0000 mg | Freq: Once | INTRAVENOUS | Status: AC
  Administered 2020-09-25: 1000 mg via INTRAVENOUS
  Filled 2020-09-25: qty 100

## 2020-09-25 MED ORDER — 0.9 % SODIUM CHLORIDE (POUR BTL) OPTIME
TOPICAL | Status: DC | PRN
  Administered 2020-09-25: 1000 mL

## 2020-09-25 MED ORDER — FENTANYL CITRATE (PF) 100 MCG/2ML IJ SOLN
INTRAMUSCULAR | Status: AC
  Filled 2020-09-25: qty 2

## 2020-09-25 MED ORDER — FENTANYL CITRATE (PF) 100 MCG/2ML IJ SOLN: 25.0000 ug | INTRAMUSCULAR | Status: DC | PRN

## 2020-09-25 MED ORDER — ONDANSETRON HCL 4 MG PO TABS: 4.0000 mg | ORAL_TABLET | Freq: Four times a day (QID) | ORAL | Status: DC | PRN

## 2020-09-25 MED ORDER — SODIUM CHLORIDE (PF) 0.9 % IJ SOLN
INTRAMUSCULAR | Status: AC
  Filled 2020-09-25: qty 50

## 2020-09-25 MED ORDER — ISOPROPYL ALCOHOL 70 % SOLN
Status: DC | PRN
  Administered 2020-09-25: 1 via TOPICAL

## 2020-09-25 MED ORDER — SODIUM CHLORIDE (PF) 0.9 % IJ SOLN
INTRAMUSCULAR | Status: DC | PRN
  Administered 2020-09-25: 30 mL

## 2020-09-25 MED ORDER — CEFAZOLIN SODIUM-DEXTROSE 2-4 GM/100ML-% IV SOLN: 2.0000 g | Freq: Four times a day (QID) | INTRAVENOUS | Status: DC

## 2020-09-25 MED ORDER — AMLODIPINE BESYLATE 5 MG PO TABS
5.0000 mg | ORAL_TABLET | Freq: Every day | ORAL | Status: DC
  Administered 2020-09-26 – 2020-09-27 (×2): 5 mg via ORAL
  Filled 2020-09-25 (×3): qty 1

## 2020-09-25 MED ORDER — ISOPROPYL ALCOHOL 70 % SOLN
Status: AC
  Filled 2020-09-25: qty 480

## 2020-09-25 MED ORDER — CEFAZOLIN SODIUM-DEXTROSE 2-4 GM/100ML-% IV SOLN
2.0000 g | INTRAVENOUS | Status: AC
  Administered 2020-09-25: 2 g via INTRAVENOUS
  Filled 2020-09-25: qty 100

## 2020-09-25 MED ORDER — ONDANSETRON HCL 4 MG/2ML IJ SOLN: 4.0000 mg | Freq: Four times a day (QID) | INTRAMUSCULAR | Status: DC | PRN

## 2020-09-25 MED ORDER — IRRISEPT - 450ML BOTTLE WITH 0.05% CHG IN STERILE WATER, USP 99.95% OPTIME
TOPICAL | Status: DC | PRN
  Administered 2020-09-25: 450 mL

## 2020-09-25 MED ORDER — CHLORHEXIDINE GLUCONATE 0.12 % MT SOLN
15.0000 mL | Freq: Once | OROMUCOSAL | Status: AC
  Administered 2020-09-25: 15 mL via OROMUCOSAL

## 2020-09-25 MED ORDER — INSULIN ASPART 100 UNIT/ML ~~LOC~~ SOLN
0.0000 [IU] | Freq: Three times a day (TID) | SUBCUTANEOUS | Status: DC
  Administered 2020-09-25 – 2020-09-26 (×3): 2 [IU] via SUBCUTANEOUS
  Administered 2020-09-26: 1 [IU] via SUBCUTANEOUS

## 2020-09-25 MED ORDER — HYDROCODONE-ACETAMINOPHEN 5-325 MG PO TABS
1.0000 | ORAL_TABLET | ORAL | Status: DC | PRN
  Administered 2020-09-25: 1 via ORAL
  Filled 2020-09-25: qty 1

## 2020-09-25 MED ORDER — METOCLOPRAMIDE HCL 5 MG PO TABS: 5.0000 mg | ORAL_TABLET | Freq: Three times a day (TID) | ORAL | Status: DC | PRN

## 2020-09-25 MED ORDER — CELECOXIB 200 MG PO CAPS
200.0000 mg | ORAL_CAPSULE | Freq: Two times a day (BID) | ORAL | Status: DC
  Administered 2020-09-25 – 2020-09-27 (×4): 200 mg via ORAL
  Filled 2020-09-25 (×5): qty 1

## 2020-09-25 MED ORDER — ASPIRIN 81 MG PO CHEW
81.0000 mg | CHEWABLE_TABLET | Freq: Two times a day (BID) | ORAL | Status: DC
  Administered 2020-09-25 – 2020-09-27 (×4): 81 mg via ORAL
  Filled 2020-09-25 (×4): qty 1

## 2020-09-25 MED ORDER — LORATADINE 10 MG PO TABS
10.0000 mg | ORAL_TABLET | Freq: Every day | ORAL | Status: DC
  Administered 2020-09-26 – 2020-09-27 (×2): 10 mg via ORAL
  Filled 2020-09-25 (×3): qty 1

## 2020-09-25 MED ORDER — POLYETHYLENE GLYCOL 3350 17 G PO PACK
17.0000 g | PACK | Freq: Every day | ORAL | Status: DC
  Administered 2020-09-25 – 2020-09-26 (×2): 17 g via ORAL
  Filled 2020-09-25 (×2): qty 1

## 2020-09-25 MED ORDER — PHENYLEPHRINE 40 MCG/ML (10ML) SYRINGE FOR IV PUSH (FOR BLOOD PRESSURE SUPPORT)
PREFILLED_SYRINGE | INTRAVENOUS | Status: AC
  Filled 2020-09-25: qty 10

## 2020-09-25 MED ORDER — SODIUM CHLORIDE 0.9 % IR SOLN
Status: DC | PRN
  Administered 2020-09-25: 3000 mL

## 2020-09-25 MED ORDER — GABAPENTIN 400 MG PO CAPS
400.0000 mg | ORAL_CAPSULE | Freq: Every day | ORAL | Status: DC
  Administered 2020-09-25 – 2020-09-26 (×2): 400 mg via ORAL
  Filled 2020-09-25 (×2): qty 1

## 2020-09-25 MED ORDER — DEXAMETHASONE SODIUM PHOSPHATE 10 MG/ML IJ SOLN
INTRAMUSCULAR | Status: DC | PRN
  Administered 2020-09-25: 4 mg via INTRAVENOUS

## 2020-09-25 MED ORDER — PANTOPRAZOLE SODIUM 40 MG PO TBEC
40.0000 mg | DELAYED_RELEASE_TABLET | Freq: Every day | ORAL | Status: DC
  Administered 2020-09-26 – 2020-09-27 (×2): 40 mg via ORAL
  Filled 2020-09-25 (×3): qty 1

## 2020-09-25 MED ORDER — SENNA 8.6 MG PO TABS
1.0000 | ORAL_TABLET | Freq: Two times a day (BID) | ORAL | Status: DC
  Administered 2020-09-25 – 2020-09-27 (×4): 8.6 mg via ORAL
  Filled 2020-09-25 (×5): qty 1

## 2020-09-25 MED ORDER — LACTATED RINGERS IV SOLN: INTRAVENOUS | Status: DC

## 2020-09-25 MED ORDER — ALUM & MAG HYDROXIDE-SIMETH 200-200-20 MG/5ML PO SUSP: 30.0000 mL | ORAL | Status: DC | PRN

## 2020-09-25 MED ORDER — CEFAZOLIN SODIUM-DEXTROSE 2-4 GM/100ML-% IV SOLN
2.0000 g | Freq: Four times a day (QID) | INTRAVENOUS | Status: AC
  Administered 2020-09-25 (×2): 2 g via INTRAVENOUS
  Filled 2020-09-25 (×2): qty 100

## 2020-09-25 MED ORDER — PHENYLEPHRINE HCL-NACL 10-0.9 MG/250ML-% IV SOLN
INTRAVENOUS | Status: DC | PRN
  Administered 2020-09-25: 25 ug/min via INTRAVENOUS

## 2020-09-25 MED ORDER — VITAMIN D 25 MCG (1000 UNIT) PO TABS
1000.0000 [IU] | ORAL_TABLET | Freq: Every day | ORAL | Status: DC
  Administered 2020-09-26 – 2020-09-27 (×2): 1000 [IU] via ORAL
  Filled 2020-09-25 (×3): qty 1

## 2020-09-25 MED ORDER — STERILE WATER FOR IRRIGATION IR SOLN
Status: DC | PRN
  Administered 2020-09-25: 2000 mL

## 2020-09-25 MED ORDER — BUPIVACAINE-EPINEPHRINE 0.25% -1:200000 IJ SOLN
INTRAMUSCULAR | Status: DC | PRN
  Administered 2020-09-25: 30 mL

## 2020-09-25 MED ORDER — LIFITEGRAST 5 % OP SOLN: 1.0000 [drp] | Freq: Two times a day (BID) | OPHTHALMIC | Status: DC

## 2020-09-25 MED ORDER — GABAPENTIN 300 MG PO CAPS
300.0000 mg | ORAL_CAPSULE | Freq: Three times a day (TID) | ORAL | Status: DC
  Administered 2020-09-25 – 2020-09-27 (×6): 300 mg via ORAL
  Filled 2020-09-25 (×7): qty 1

## 2020-09-25 MED ORDER — METHOCARBAMOL 500 MG PO TABS
500.0000 mg | ORAL_TABLET | Freq: Four times a day (QID) | ORAL | Status: DC | PRN
  Administered 2020-09-25 – 2020-09-26 (×2): 500 mg via ORAL
  Filled 2020-09-25 (×2): qty 1

## 2020-09-25 MED ORDER — KETOROLAC TROMETHAMINE 30 MG/ML IJ SOLN
INTRAMUSCULAR | Status: AC
  Filled 2020-09-25: qty 1

## 2020-09-25 MED ORDER — DEXAMETHASONE SODIUM PHOSPHATE 10 MG/ML IJ SOLN
10.0000 mg | Freq: Once | INTRAMUSCULAR | Status: AC
  Administered 2020-09-26: 10 mg via INTRAVENOUS
  Filled 2020-09-25: qty 1

## 2020-09-25 MED ORDER — HYDROCODONE-ACETAMINOPHEN 7.5-325 MG PO TABS
1.0000 | ORAL_TABLET | ORAL | Status: DC | PRN
  Administered 2020-09-25 – 2020-09-26 (×3): 1 via ORAL
  Administered 2020-09-26: 2 via ORAL
  Administered 2020-09-26: 1 via ORAL
  Administered 2020-09-26 – 2020-09-27 (×3): 2 via ORAL
  Filled 2020-09-25 (×2): qty 1
  Filled 2020-09-25 (×3): qty 2
  Filled 2020-09-25: qty 1
  Filled 2020-09-25: qty 2
  Filled 2020-09-25 (×2): qty 1

## 2020-09-25 MED ORDER — METOCLOPRAMIDE HCL 5 MG/ML IJ SOLN: 5.0000 mg | Freq: Three times a day (TID) | INTRAMUSCULAR | Status: DC | PRN

## 2020-09-25 MED ORDER — ACETAMINOPHEN 325 MG PO TABS: 325.0000 mg | ORAL_TABLET | Freq: Four times a day (QID) | ORAL | Status: DC | PRN

## 2020-09-25 MED ORDER — FENTANYL CITRATE (PF) 100 MCG/2ML IJ SOLN
50.0000 ug | Freq: Once | INTRAMUSCULAR | Status: AC
  Administered 2020-09-25: 50 ug via INTRAVENOUS
  Filled 2020-09-25: qty 2

## 2020-09-25 MED ORDER — BUPIVACAINE IN DEXTROSE 0.75-8.25 % IT SOLN
INTRATHECAL | Status: DC | PRN
  Administered 2020-09-25: 2 mL via INTRATHECAL

## 2020-09-25 MED ORDER — ONDANSETRON HCL 4 MG/2ML IJ SOLN: 4.0000 mg | Freq: Once | INTRAMUSCULAR | Status: DC | PRN

## 2020-09-25 MED ORDER — POVIDONE-IODINE 10 % EX SWAB: 2.0000 "application " | Freq: Once | CUTANEOUS | Status: DC

## 2020-09-25 MED ORDER — METFORMIN HCL 500 MG PO TABS
1000.0000 mg | ORAL_TABLET | Freq: Two times a day (BID) | ORAL | Status: DC
  Administered 2020-09-25 – 2020-09-27 (×4): 1000 mg via ORAL
  Filled 2020-09-25 (×4): qty 2

## 2020-09-25 MED ORDER — PROPOFOL 1000 MG/100ML IV EMUL
INTRAVENOUS | Status: AC
  Filled 2020-09-25: qty 200

## 2020-09-25 MED ORDER — DIPHENHYDRAMINE HCL 12.5 MG/5ML PO ELIX: 12.5000 mg | ORAL_SOLUTION | ORAL | Status: DC | PRN

## 2020-09-25 MED ORDER — SODIUM CHLORIDE 0.9 % IR SOLN
Status: DC | PRN
  Administered 2020-09-25: 1000 mL

## 2020-09-25 MED ORDER — PHENOL 1.4 % MT LIQD: 1.0000 | OROMUCOSAL | Status: DC | PRN

## 2020-09-25 MED ORDER — ONDANSETRON HCL 4 MG/2ML IJ SOLN
INTRAMUSCULAR | Status: DC | PRN
  Administered 2020-09-25: 4 mg via INTRAVENOUS

## 2020-09-25 MED ORDER — MIDAZOLAM HCL 2 MG/2ML IJ SOLN
1.0000 mg | Freq: Once | INTRAMUSCULAR | Status: AC
  Administered 2020-09-25: 1 mg via INTRAVENOUS
  Filled 2020-09-25: qty 2

## 2020-09-25 SURGICAL SUPPLY — 72 items
BAG ZIPLOCK 12X15 (MISCELLANEOUS) IMPLANT
BATTERY INSTRU NAVIGATION (MISCELLANEOUS) ×9 IMPLANT
BLADE SAW RECIPROCATING 77.5 (BLADE) ×3 IMPLANT
BNDG ELASTIC 4X5.8 VLCR STR LF (GAUZE/BANDAGES/DRESSINGS) ×3 IMPLANT
BNDG ELASTIC 6X5.8 VLCR STR LF (GAUZE/BANDAGES/DRESSINGS) ×3 IMPLANT
CHLORAPREP W/TINT 26 (MISCELLANEOUS) ×6 IMPLANT
COVER SURGICAL LIGHT HANDLE (MISCELLANEOUS) ×3 IMPLANT
COVER WAND RF STERILE (DRAPES) ×3 IMPLANT
CUFF TOURN SGL QUICK 34 (TOURNIQUET CUFF) ×2
CUFF TRNQT CYL 34X4.125X (TOURNIQUET CUFF) ×1 IMPLANT
DECANTER SPIKE VIAL GLASS SM (MISCELLANEOUS) ×6 IMPLANT
DERMABOND ADVANCED (GAUZE/BANDAGES/DRESSINGS) ×4
DERMABOND ADVANCED .7 DNX12 (GAUZE/BANDAGES/DRESSINGS) ×2 IMPLANT
DRAPE EXTREMITY T 121X128X90 (DISPOSABLE) ×3 IMPLANT
DRAPE SHEET LG 3/4 BI-LAMINATE (DRAPES) ×9 IMPLANT
DRAPE U-SHAPE 47X51 STRL (DRAPES) ×3 IMPLANT
DRESSING AQUACEL AG SP 3.5X10 (GAUZE/BANDAGES/DRESSINGS) ×1 IMPLANT
DRSG AQUACEL AG ADV 3.5X10 (GAUZE/BANDAGES/DRESSINGS) ×3 IMPLANT
DRSG AQUACEL AG SP 3.5X10 (GAUZE/BANDAGES/DRESSINGS) ×3
DRSG TEGADERM 4X4.75 (GAUZE/BANDAGES/DRESSINGS) IMPLANT
ELECT BLADE TIP CTD 4 INCH (ELECTRODE) ×3 IMPLANT
ELECT REM PT RETURN 15FT ADLT (MISCELLANEOUS) ×3 IMPLANT
EVACUATOR 1/8 PVC DRAIN (DRAIN) IMPLANT
GAUZE SPONGE 4X4 12PLY STRL (GAUZE/BANDAGES/DRESSINGS) ×3 IMPLANT
GLOVE BIO SURGEON STRL SZ8.5 (GLOVE) ×6 IMPLANT
GLOVE BIOGEL M STRL SZ7.5 (GLOVE) ×9 IMPLANT
GLOVE BIOGEL PI IND STRL 8 (GLOVE) ×2 IMPLANT
GLOVE BIOGEL PI IND STRL 8.5 (GLOVE) ×1 IMPLANT
GLOVE BIOGEL PI INDICATOR 8 (GLOVE) ×4
GLOVE BIOGEL PI INDICATOR 8.5 (GLOVE) ×2
GOWN SPEC L3 XXLG W/TWL (GOWN DISPOSABLE) ×3 IMPLANT
GOWN SPEC L4 XLG W/TWL (GOWN DISPOSABLE) ×3 IMPLANT
HANDPIECE INTERPULSE COAX TIP (DISPOSABLE) ×2
HOLDER FOLEY CATH W/STRAP (MISCELLANEOUS) ×3 IMPLANT
HOOD PEEL AWAY FLYTE STAYCOOL (MISCELLANEOUS) ×9 IMPLANT
INSERT TIBIAL BEAR SZ4 16MMX3 (Insert) ×3 IMPLANT
JET LAVAGE IRRISEPT WOUND (IRRIGATION / IRRIGATOR) ×3
KIT TURNOVER KIT A (KITS) IMPLANT
KNEE FEMORAL COMP RT RETAIN (Knees) ×3 IMPLANT
KNEE PATELLA ASYMMETRIC 10X35 (Knees) ×3 IMPLANT
KNEE TIBIAL COMP TRI SZ4 (Knees) ×3 IMPLANT
LAVAGE JET IRRISEPT WOUND (IRRIGATION / IRRIGATOR) ×1 IMPLANT
MARKER SKIN DUAL TIP RULER LAB (MISCELLANEOUS) ×3 IMPLANT
NDL SAFETY ECLIPSE 18X1.5 (NEEDLE) ×2 IMPLANT
NEEDLE HYPO 18GX1.5 SHARP (NEEDLE) ×4
NEEDLE SPNL 18GX3.5 QUINCKE PK (NEEDLE) ×3 IMPLANT
NS IRRIG 1000ML POUR BTL (IV SOLUTION) ×3 IMPLANT
PACK TOTAL KNEE CUSTOM (KITS) ×3 IMPLANT
PADDING CAST COTTON 6X4 STRL (CAST SUPPLIES) ×3 IMPLANT
PENCIL SMOKE EVACUATOR (MISCELLANEOUS) IMPLANT
PIN FLUTED HEDLESS FIX 3.5X1/8 (PIN) ×3 IMPLANT
PROTECTOR NERVE ULNAR (MISCELLANEOUS) ×3 IMPLANT
SAW OSC TIP CART 19.5X105X1.3 (SAW) ×3 IMPLANT
SEALER BIPOLAR AQUA 6.0 (INSTRUMENTS) ×3 IMPLANT
SET HNDPC FAN SPRY TIP SCT (DISPOSABLE) ×1 IMPLANT
SET PAD KNEE POSITIONER (MISCELLANEOUS) ×3 IMPLANT
SPONGE DRAIN TRACH 4X4 STRL 2S (GAUZE/BANDAGES/DRESSINGS) IMPLANT
SUT MNCRL AB 3-0 PS2 18 (SUTURE) ×3 IMPLANT
SUT MNCRL AB 4-0 PS2 18 (SUTURE) ×3 IMPLANT
SUT MON AB 2-0 CT1 36 (SUTURE) ×6 IMPLANT
SUT STRATAFIX PDO 1 14 VIOLET (SUTURE) ×2
SUT STRATFX PDO 1 14 VIOLET (SUTURE) ×1
SUT VIC AB 1 CTX 36 (SUTURE) ×4
SUT VIC AB 1 CTX36XBRD ANBCTR (SUTURE) ×2 IMPLANT
SUT VIC AB 2-0 CT1 27 (SUTURE) ×2
SUT VIC AB 2-0 CT1 TAPERPNT 27 (SUTURE) ×1 IMPLANT
SUTURE STRATFX PDO 1 14 VIOLET (SUTURE) ×1 IMPLANT
SYR 3ML LL SCALE MARK (SYRINGE) ×6 IMPLANT
TOWER CARTRIDGE SMART MIX (DISPOSABLE) IMPLANT
TRAY FOLEY MTR SLVR 16FR STAT (SET/KITS/TRAYS/PACK) IMPLANT
TUBE SUCTION HIGH CAP CLEAR NV (SUCTIONS) ×3 IMPLANT
WATER STERILE IRR 1000ML POUR (IV SOLUTION) ×6 IMPLANT

## 2020-09-25 NOTE — Transfer of Care (Signed)
Immediate Anesthesia Transfer of Care Note  Patient: April Jones  Procedure(s) Performed: Procedure(s): COMPUTER ASSISTED TOTAL KNEE ARTHROPLASTY (Right)  Patient Location: PACU  Anesthesia Type:Spinal  Level of Consciousness:  sedated, patient cooperative and responds to stimulation  Airway & Oxygen Therapy:Patient Spontanous Breathing and Patient connected to face mask oxgen  Post-op Assessment:  Report given to PACU RN and Post -op Vital signs reviewed and stable  Post vital signs:  Reviewed and stable  Last Vitals:  Vitals:   09/25/20 0758 09/25/20 1131  BP:  118/76  Pulse: 87 91  Resp: 12 13  Temp:  (P) 36.7 C  SpO2: 100%     Complications: No apparent anesthesia complications

## 2020-09-25 NOTE — Interval H&P Note (Signed)
History and Physical Interval Note:  09/25/2020 7:51 AM  April Jones  has presented today for surgery, with the diagnosis of Degenerative joint disease right knee.  The various methods of treatment have been discussed with the patient and family. After consideration of risks, benefits and other options for treatment, the patient has consented to  Procedure(s): COMPUTER ASSISTED TOTAL KNEE ARTHROPLASTY (Right) as a surgical intervention.  The patient's history has been reviewed, patient examined, no change in status, stable for surgery.  I have reviewed the patient's chart and labs.  Questions were answered to the patient's satisfaction.     April Jones

## 2020-09-25 NOTE — Anesthesia Procedure Notes (Signed)
Anesthesia Regional Block: Adductor canal block   Pre-Anesthetic Checklist: ,, timeout performed, Correct Patient, Correct Site, Correct Laterality, Correct Procedure, Correct Position, site marked, Risks and benefits discussed,  Surgical consent,  Pre-op evaluation,  At surgeon's request and post-op pain management  Laterality: Right  Prep: chloraprep       Needles:  Injection technique: Single-shot  Needle Type: Echogenic Stimulator Needle      Needle Gauge: 20     Additional Needles:   Procedures:,,,, ultrasound used (permanent image in chart),,,,  Narrative:  Start time: 09/25/2020 7:50 AM End time: 09/25/2020 7:55 AM Injection made incrementally with aspirations every 5 mL.  Performed by: Personally  Anesthesiologist: Mellody Dance, MD  Additional Notes: A functioning IV was confirmed and monitors were applied.  Sterile prep and drape, hand hygiene and sterile gloves were used.  Negative aspiration and test dose prior to incremental administration of local anesthetic. The patient tolerated the procedure well.Ultrasound  guidance: relevant anatomy identified, needle position confirmed, local anesthetic spread visualized around nerve(s), vascular puncture avoided.  Image printed for medical record.

## 2020-09-25 NOTE — Plan of Care (Signed)
Patient admitted to unit, care plans initiated. Kathrynn Backstrom, RN  

## 2020-09-25 NOTE — Op Note (Signed)
OPERATIVE REPORT  SURGEON: Rod Can, MD   ASSISTANT: Cherlynn June, PA-C.  PREOPERATIVE DIAGNOSIS: Right knee arthritis.   POSTOPERATIVE DIAGNOSIS: Right knee arthritis.   PROCEDURE: Right total knee arthroplasty.   IMPLANTS: Stryker Triathlon CR femur, size 4. Stryker Tritanium tibia, size 4. X3 polyethelyene insert, size 16 mm, CS. 3 button asymmetric patella, size 35 mm.  ANESTHESIA:  MAC, Regional and Spinal  TOURNIQUET TIME: Not utilized.   ESTIMATED BLOOD LOSS:-100 mL    ANTIBIOTICS: 2g Ancef.  DRAINS: None.  COMPLICATIONS: None   CONDITION: PACU - hemodynamically stable.   BRIEF CLINICAL NOTE: April Jones is a 74 y.o. female with a long-standing history of Right knee arthritis. After failing conservative management, the patient was indicated for total knee arthroplasty. The risks, benefits, and alternatives to the procedure were explained, and the patient elected to proceed.  PROCEDURE IN DETAIL: Adductor canal block was obtained in the pre-op holding area. Once inside the operative room, spinal anesthesia was obtained, and a foley catheter was inserted. The patient was then positioned, a nonsterile tourniquet was placed, and the lower extremity was prepped and draped in the normal sterile surgical fashion.  A time-out was called verifying side and site of surgery. The patient received IV antibiotics within 60 minutes of beginning the procedure. The tourniquet was not utilized.   An anterior approach to the knee was performed utilizing a midvastus arthrotomy. A medial release was performed and the patellar fat pad was excised. The PCL was attenuated. Stryker navigation was used to cut the distal femur perpendicular to the mechanical axis. A freehand patellar resection was performed, and the patella was sized an prepared with 3 lug holes.  Nagivation was used  to make a neutral proximal tibia resection, taking 9 mm of bone from the less affected lateral side with 3 degrees of slope. The menisci were excised. A spacer block was placed, and the alignment and balance in extension were confirmed.   The distal femur was sized using the 3-degree external rotation guide referencing the posterior femoral cortex. The appropriate 4-in-1 cutting block was pinned into place. Rotation was checked using Whiteside's line, the epicondylar axis, and then confirmed with a spacer block in flexion. The remaining femoral cuts were performed, taking care to protect the MCL.  The tibia was sized and the trial tray was pinned into place. The remaining trail components were inserted. The knee was stable to varus and valgus stress through a full range of motion. The patella tracked centrally. The trial components were removed, and the proximal tibial surface was prepared. Final components were impacted into place. The knee was tested for a final time and found to be well balanced.   The wound was copiously irrigated with Irrisept solution and normal saline using pule lavage.  Marcaine solution was injected into the periarticular soft tissue.  The wound was closed in layers using #1 Vicryl and Stratafix for the fascia, 2-0 Vicryl for the subcutaneous fat, 2-0 Monocryl for the deep dermal layer, 3-0 running Monocryl subcuticular Stitch, and 4-0 Monocryl stay sutures at both ends of the wound. Dermabond was applied to the skin.  Once the glue was fully dried, an Aquacell Ag and compressive dressing were applied.  The patient was transported to the recovery room in stable condition.  Sponge, needle, and instrument counts were correct at the end of the case x2.  The patient tolerated the procedure well and there were no known complications.  Please note that a surgical assistant  was a medical necessity for this procedure in order to perform it in a safe and expeditious manner. Surgical  assistant was necessary to retract the ligaments and vital neurovascular structures to prevent injury to them and also necessary for proper positioning of the limb to allow for anatomic placement of the prosthesis.

## 2020-09-25 NOTE — Anesthesia Postprocedure Evaluation (Signed)
Anesthesia Post Note  Patient: April Jones  Procedure(s) Performed: COMPUTER ASSISTED TOTAL KNEE ARTHROPLASTY (Right Knee)     Patient location during evaluation: PACU Anesthesia Type: Regional and Spinal Level of consciousness: awake and alert Pain management: pain level controlled Vital Signs Assessment: post-procedure vital signs reviewed and stable Respiratory status: spontaneous breathing and respiratory function stable Cardiovascular status: blood pressure returned to baseline and stable Postop Assessment: spinal receding Anesthetic complications: no   No complications documented.  Last Vitals:  Vitals:   09/25/20 1215 09/25/20 1241  BP: 122/73 114/73  Pulse: 87 86  Resp: 11 14  Temp:  36.7 C  SpO2: 99% 100%    Last Pain:  Vitals:   09/25/20 1241  TempSrc: Oral  PainSc: 0-No pain                 Mellody Dance

## 2020-09-25 NOTE — Care Plan (Signed)
Ortho Bundle Case Management Note  Patient Details  Name: April Jones MRN: [id number] Date of Birth: 19-Sep-1946  R TKA on 09-25-20 DCP:  Home with dtr.  2 story home with 1-2 ste. DME:  Has a RW.  3-in-1 ordered through [facility] PT:  [employer].  PT eval scheduled for 09-27-20.                   DME Arranged:  3-N-1 DME Agency:  [employer]  HH Arranged:  NA HH Agency:  NA  Additional Comments: Please contact me with any questions of if this plan should need to change.  Ennis Forts, RN,CCM [facility]  [phone] 09/25/2020, 9:33 AM

## 2020-09-25 NOTE — Evaluation (Signed)
Physical Therapy Evaluation Patient Details Name: April Jones MRN: [id number] DOB: 17-Dec-1945 Today's Date: 09/25/2020   History of Present Illness  S/P RTKA, H/O DM and neuropathy  Clinical Impression  The patient ambulated x 32' with Rw. Plans Dc to daughter's home. Pt admitted with above diagnosis.   Pt currently with functional limitations due to the deficits listed below (see PT Problem List). Pt will benefit from skilled PT to increase their independence and safety with mobility to allow discharge to the venue listed below.       Follow Up Recommendations Follow surgeon's recommendation for DC plan and follow-up therapies;Outpatient PT    Equipment Recommendations  None recommended by PT    Recommendations for Other Services       Precautions / Restrictions Precautions Precautions: Fall;Knee Restrictions RLE Weight Bearing: Weight bearing as tolerated      Mobility  Bed Mobility Overal bed mobility: Needs Assistance Bed Mobility: Supine to Sit     Supine to sit: Supervision;HOB elevated        Transfers Overall transfer level: Needs assistance Equipment used: Rolling walker (2 wheeled) Transfers: Sit to/from Stand Sit to Stand: Min assist         General transfer comment: cues for hand placement  Ambulation/Gait Ambulation/Gait assistance: Min assist Gait Distance (Feet): 50 Feet Assistive device: Rolling walker (2 wheeled) Gait Pattern/deviations: Step-to pattern     General Gait Details: cues for sequence  Stairs            Wheelchair Mobility    Modified Rankin (Stroke Patients Only)       Balance Overall balance assessment: Needs assistance Sitting-balance support: Bilateral upper extremity supported;Feet supported Sitting balance-Leahy Scale: Good     Standing balance support: During functional activity;Bilateral upper extremity supported Standing balance-Leahy Scale: Good                                Pertinent Vitals/Pain Pain Assessment: 0-10 Pain Score: 6  Pain Location: right knee Pain Descriptors / Indicators: Aching Pain Intervention(s): Monitored during session;Limited activity within patient's tolerance;Patient requesting pain meds-RN notified;Ice applied    Home Living Family/patient expects to be discharged to:: (P) Private residence Living Arrangements: Children Available Help at Discharge: Family Type of Home: House Home Access: Stairs to enter Entrance Stairs-Rails: None Entrance Stairs-Number of Steps: 3 at sidewalk at daughter's home Home Layout: One level Home Equipment: Environmental consultant - 2 wheels Additional Comments: will stay with  a daughter    Prior Function Level of Independence: Independent with assistive device(s)               Hand Dominance        Extremity/Trunk Assessment   Upper Extremity Assessment Upper Extremity Assessment: Overall WFL for tasks assessed    Lower Extremity Assessment Lower Extremity Assessment: RLE deficits/detail;LLE deficits/detail RLE Deficits / Details: performs SLR, knee flexion to 60 seated RLE Sensation: history of peripheral neuropathy LLE Sensation: history of peripheral neuropathy       Communication   Communication: No difficulties  Cognition Arousal/Alertness: Awake/alert Behavior During Therapy: WFL for tasks assessed/performed Overall Cognitive Status: Within Functional Limits for tasks assessed                                        General Comments      Exercises  Assessment/Plan    PT Assessment Patient needs continued PT services  PT Problem List Decreased strength;Decreased range of motion;Decreased knowledge of use of DME;Decreased activity tolerance;Decreased safety awareness;Decreased knowledge of precautions;Decreased mobility       PT Treatment Interventions DME instruction;Gait training;Stair training;Functional mobility training;Patient/family  education;Therapeutic activities;Therapeutic exercise    PT Goals (Current goals can be found in the Care Plan section)  Acute Rehab PT Goals Patient Stated Goal: walk without pain PT Goal Formulation: With patient Time For Goal Achievement: 10/02/20 Potential to Achieve Goals: Good    Frequency 7X/week   Barriers to discharge        Co-evaluation               AM-PAC PT "6 Clicks" Mobility  Outcome Measure Help needed turning from your back to your side while in a flat bed without using bedrails?: None Help needed moving from lying on your back to sitting on the side of a flat bed without using bedrails?: None Help needed moving to and from a bed to a chair (including a wheelchair)?: A Little Help needed standing up from a chair using your arms (e.g., wheelchair or bedside chair)?: A Little Help needed to walk in hospital room?: A Little Help needed climbing 3-5 steps with a railing? : A Lot 6 Click Score: 19    End of Session Equipment Utilized During Treatment: Gait belt Activity Tolerance: Patient tolerated treatment well Patient left: in chair;with call bell/phone within reach;with chair alarm set;with nursing/sitter in room Nurse Communication: Mobility status PT Visit Diagnosis: Unsteadiness on feet (R26.81);Difficulty in walking, not elsewhere classified (R26.2);Pain Pain - Right/Left: Right Pain - part of body: Knee    Time: 1540-1615 PT Time Calculation (min) (ACUTE ONLY): 35 min   Charges:   PT Evaluation $PT Eval Low Complexity: 1 Low PT Treatments $Gait Training: 8-22 mins        Blanchard Kelch PT Acute Rehabilitation Services Pager [phone] Office [phone]   Rada Hay 09/25/2020, 4:42 PM

## 2020-09-25 NOTE — Anesthesia Procedure Notes (Signed)
Spinal  Patient location during procedure: OR Start time: 09/25/2020 8:36 AM End time: 09/25/2020 8:40 AM Staffing Performed: anesthesiologist  Anesthesiologist: Mellody Dance, MD Preanesthetic Checklist Completed: patient identified, IV checked, risks and benefits discussed, surgical consent, monitors and equipment checked, pre-op evaluation and timeout performed Spinal Block Patient position: sitting Prep: DuraPrep and site prepped and draped Patient monitoring: continuous pulse ox, blood pressure and heart rate Approach: midline Location: L3-4 Injection technique: single-shot Needle Needle type: Pencan  Needle gauge: 24 G Needle length: 9 cm Additional Notes Functioning IV was confirmed and monitors were applied. Sterile prep and drape, including hand hygiene and sterile gloves were used. The patient was positioned and the spine was prepped. The skin was anesthetized with lidocaine.  Free flow of clear CSF was obtained prior to injecting local anesthetic into the CSF. The needle was carefully withdrawn. The patient tolerated the procedure well.

## 2020-09-25 NOTE — Progress Notes (Signed)
Assisted Dr. Candra Bass with Right Knee Adductor Canal block. Side rails up, monitors on throughout procedure. See vital signs in flow sheet. Tolerated Procedure well.  

## 2020-09-25 NOTE — Discharge Instructions (Signed)
° °Dr. Landin Tallon °Total Joint Specialist °[facility] °[address] °[place], [place] [address] °[phone] ° °TOTAL KNEE REPLACEMENT POSTOPERATIVE DIRECTIONS ° ° ° °Knee Rehabilitation, Guidelines Following Surgery  °Results after knee surgery are often greatly improved when you follow the exercise, range of motion and muscle strengthening exercises prescribed by your doctor. Safety measures are also important to protect the knee from further injury. Any time any of these exercises cause you to have increased pain or swelling in your knee joint, decrease the amount until you are comfortable again and slowly increase them. If you have problems or questions, call your caregiver or physical therapist for advice.  ° °WEIGHT BEARING °Weight bearing as tolerated with assist device (walker, cane, etc) as directed, use it as long as suggested by your surgeon or therapist, typically at least 4-6 weeks. ° °HOME CARE INSTRUCTIONS  °Remove items at home which could result in a fall. This includes throw rugs or furniture in walking pathways.  °Continue medications as instructed at time of discharge. °You may have some home medications which will be placed on hold until you complete the course of blood thinner medication.  °You may start showering once you are discharged home but do not submerge the incision under water. Just pat the incision dry and apply a dry gauze dressing on daily. °Walk with walker as instructed.  °You may resume a sexual relationship in one month or when given the OK by your doctor.  °· Use walker as long as suggested by your caregivers. °· Avoid periods of inactivity such as sitting longer than an hour when not asleep. This helps prevent blood clots.  °You may put full weight on your legs and walk as much as is comfortable.  °You may return to work once you are cleared by your doctor.  °Do not drive a car for 6 weeks or until released by you surgeon.  °· Do not drive  while taking narcotics.  °Wear the elastic stockings for three weeks following surgery during the day but you may remove then at night. °Make sure you keep all of your appointments after your operation with all of your doctors and caregivers. You should call the office at the above phone number and make an appointment for approximately two weeks after the date of your surgery. °Do not remove your surgical dressing. The dressing is waterproof; you may take showers in 3 days, but do not take tub baths or submerge the dressing. °Please pick up a stool softener and laxative for home use as long as you are requiring pain medications. °· ICE to the affected knee every three hours for 30 minutes at a time and then as needed for pain and swelling.  Continue to use ice on the knee for pain and swelling from surgery. You may notice swelling that will progress down to the foot and ankle.  This is normal after surgery.  Elevate the leg when you are not up walking on it.   °It is important for you to complete the blood thinner medication as prescribed by your doctor. °· Continue to use the breathing machine which will help keep your temperature down.  It is common for your temperature to cycle up and down following surgery, especially at night when you are not up moving around and exerting yourself.  The breathing machine keeps your lungs expanded and your temperature down. ° °RANGE OF MOTION AND STRENGTHENING EXERCISES  °Rehabilitation of the knee is important following   a knee injury or an operation. After just a few days of immobilization, the muscles of the thigh which control the knee become weakened and shrink (atrophy). Knee exercises are designed to build up the tone and strength of the thigh muscles and to improve knee motion. Often times heat used for twenty to thirty minutes before working out will loosen up your tissues and help with improving the range of motion but do not use heat for the first two weeks following  surgery. These exercises can be done on a training (exercise) mat, on the floor, on a table or on a bed. Use what ever works the best and is most comfortable for you Knee exercises include:  °Leg Lifts - While your knee is still immobilized in a splint or cast, you can do straight leg raises. Lift the leg to 60 degrees, hold for 3 sec, and slowly lower the leg. Repeat 10-20 times 2-3 times daily. Perform this exercise against resistance later as your knee gets better.  °Quad and Hamstring Sets - Tighten up the muscle on the front of the thigh (Quad) and hold for 5-10 sec. Repeat this 10-20 times hourly. Hamstring sets are done by pushing the foot backward against an object and holding for 5-10 sec. Repeat as with quad sets.  °A rehabilitation program following serious knee injuries can speed recovery and prevent re-injury in the future due to weakened muscles. Contact your doctor or a physical therapist for more information on knee rehabilitation.  ° °SKILLED REHAB INSTRUCTIONS: °If the patient is transferred to a skilled rehab facility following release from the hospital, a list of the current medications will be sent to the facility for the patient to continue.  When discharged from the skilled rehab facility, please have the facility set up the patient's Home Health Physical Therapy prior to being released. Also, the skilled facility will be responsible for providing the patient with their medications at time of release from the facility to include their pain medication, the muscle relaxants, and their blood thinner medication. If the patient is still at the rehab facility at time of the two week follow up appointment, the skilled rehab facility will also need to assist the patient in arranging follow up appointment in our office and any transportation needs. ° °MAKE SURE YOU:  °Understand these instructions.  °Will watch your condition.  °Will get help right away if you are not doing well or get worse.   ° ° °Pick up stool softner and laxative for home use following surgery while on pain medications. °Do NOT remove your dressing. You may shower.  °Do not take tub baths or submerge incision under water. °May shower starting three days after surgery. °Please use a clean towel to pat the incision dry following showers. °Continue to use ice for pain and swelling after surgery. °Do not use any lotions or creams on the incision until instructed by your surgeon. ° °

## 2020-09-26 ENCOUNTER — Encounter (HOSPITAL_COMMUNITY): Payer: Self-pay | Admitting: Orthopedic Surgery

## 2020-09-26 LAB — BASIC METABOLIC PANEL
Anion gap: 8 (ref 5–15)
Anion gap: 9 (ref 5–15)
BUN: 16 mg/dL (ref 8–23)
BUN: 19 mg/dL (ref 8–23)
CO2: 23 mmol/L (ref 22–32)
CO2: 24 mmol/L (ref 22–32)
Calcium: 8.5 mg/dL — ABNORMAL LOW (ref 8.9–10.3)
Calcium: 8.5 mg/dL — ABNORMAL LOW (ref 8.9–10.3)
Chloride: 107 mmol/L (ref 98–111)
Chloride: 104 mmol/L (ref 98–111)
Creatinine, Ser: 1.03 mg/dL — ABNORMAL HIGH (ref 0.44–1.00)
Creatinine, Ser: 1.03 mg/dL — ABNORMAL HIGH (ref 0.44–1.00)
GFR, Estimated: 53 mL/min — ABNORMAL LOW (ref >60)
GFR, Estimated: 53 mL/min — ABNORMAL LOW (ref >60)
Glucose, Bld: 166 mg/dL — ABNORMAL HIGH (ref 70–99)
Glucose, Bld: 163 mg/dL — ABNORMAL HIGH (ref 70–99)
Potassium: 4.3 mmol/L (ref 3.5–5.1)
Potassium: 4.4 mmol/L (ref 3.5–5.1)
Sodium: 138 mmol/L (ref 135–145)
Sodium: 137 mmol/L (ref 135–145)

## 2020-09-26 LAB — CBC
HCT: 32.7 % — ABNORMAL LOW (ref 36.0–46.0)
Hemoglobin: 10.2 g/dL — ABNORMAL LOW (ref 12.0–15.0)
MCH: 29.6 pg (ref 26.0–34.0)
MCHC: 31.2 g/dL (ref 30.0–36.0)
MCV: 94.8 fL (ref 80.0–100.0)
Platelets: 395 10*3/uL (ref 150–400)
RBC: 3.45 MIL/uL — ABNORMAL LOW (ref 3.87–5.11)
RDW: 14.6 % (ref 11.5–15.5)
WBC: 11.7 10*3/uL — ABNORMAL HIGH (ref 4.0–10.5)
nRBC: 0 % (ref 0.0–0.2)

## 2020-09-26 LAB — HEMOGLOBIN A1C
Hgb A1c MFr Bld: 6.5 % — ABNORMAL HIGH (ref 4.8–5.6)
Mean Plasma Glucose: 140 mg/dL

## 2020-09-26 LAB — GLUCOSE, CAPILLARY
Glucose-Capillary: 129 mg/dL — ABNORMAL HIGH (ref 70–99)
Glucose-Capillary: 178 mg/dL — ABNORMAL HIGH (ref 70–99)
Glucose-Capillary: 178 mg/dL — ABNORMAL HIGH (ref 70–99)
Glucose-Capillary: 142 mg/dL — ABNORMAL HIGH (ref 70–99)

## 2020-09-26 MED ORDER — ONDANSETRON HCL 4 MG PO TABS: 4.0000 mg | ORAL_TABLET | Freq: Four times a day (QID) | ORAL | 0 refills | Status: AC | PRN

## 2020-09-26 MED ORDER — HYDROCODONE-ACETAMINOPHEN 7.5-325 MG PO TABS: 1.0000 | ORAL_TABLET | ORAL | 0 refills | Status: AC | PRN

## 2020-09-26 MED ORDER — DOCUSATE SODIUM 100 MG PO CAPS: 100.0000 mg | ORAL_CAPSULE | Freq: Two times a day (BID) | ORAL | 0 refills | Status: AC

## 2020-09-26 MED ORDER — ASPIRIN 81 MG PO CHEW: 81.0000 mg | CHEWABLE_TABLET | Freq: Two times a day (BID) | ORAL | 0 refills | Status: AC

## 2020-09-26 MED ORDER — SENNA 8.6 MG PO TABS: 1.0000 | ORAL_TABLET | Freq: Two times a day (BID) | ORAL | 0 refills | Status: AC

## 2020-09-26 NOTE — Progress Notes (Addendum)
    Subjective:  Patient reports pain as mild to moderate.  Denies N/V/CP/SOB. States knee hurts less than her normal neuropathic foot pain.  Objective:   VITALS:   Vitals:   09/25/20 2022 09/26/20 0136 09/26/20 0642 09/26/20 0905  BP: 122/72 108/65 116/68 120/70  Pulse: 92 89 87 92  Resp: 16 16 14 16   Temp: 97.8 F (36.6 C) 97.7 F (36.5 C) 97.8 F (36.6 C) 98.5 F (36.9 C)  TempSrc: Oral Oral Oral Oral  SpO2: 98% 98% 96% 96%  Weight:      Height:        NAD ABD soft Intact pulses distally Dorsiflexion/Plantar flexion intact Incision: dressing C/D/I Compartment soft subj sensory change due to neuropathy  Lab Results  Component Value Date   WBC 11.7 (H) 09/26/2020   HGB 10.2 (L) 09/26/2020   HCT 32.7 (L) 09/26/2020   MCV 94.8 09/26/2020   PLT 395 09/26/2020   BMET    Component Value Date/Time   NA 138 09/26/2020 0339   K 4.3 09/26/2020 0339   CL 107 09/26/2020 0339   CO2 23 09/26/2020 0339   GLUCOSE 166 (H) 09/26/2020 0339   BUN 16 09/26/2020 0339   CREATININE 1.03 (H) 09/26/2020 0339   CALCIUM 8.5 (L) 09/26/2020 0339   GFRNONAA 53 (L) 09/26/2020 0339   GFRAA >60 09/12/2020 1130     Assessment/Plan: 1 Day Post-Op   Active Problems:   Osteoarthritis of right knee   WBAT with walker DVT ppx: Aspirin, SCDs, TEDS PO pain control PT/OT Mild Cr elevation: likely due to volume loss, will continue IVFs for now, will recheck BMP at 1300 Dispo: D/C home after clears PT and Cr WNL   09/14/2020 Chirstina Haan 09/26/2020, 11:29 AM   09/28/2020, MD [phone] [facility] is now [facility]  [place] [address], [place], [place] Phone: [phone] [email or web address] Facebook  [phone]

## 2020-09-26 NOTE — Progress Notes (Signed)
Physical Therapy Treatment Patient Details Name: April Jones MRN: [id number] DOB: May 21, 1946 Today's Date: 09/26/2020    History of Present Illness S/P RTKA, H/O DM and neuropathy    PT Comments    Patient ambulated x 80'. Patient reports significant discomfort of both feet R/T neuropathy. Plans Dc tomorrow if labs WNL.   Follow Up Recommendations  Follow surgeons recommendation for DC plan and follow-up therapies;Outpatient PT     Equipment Recommendations  3in1 (PT)    Recommendations for Other Services       Precautions / Restrictions Precautions Precautions: Fall;Knee    Mobility  Bed Mobility   Bed Mobility: Sit to Supine     Supine to sit: Supervision;HOB elevated Sit to supine: Supervision      Transfers   Equipment used: Rolling walker (2 wheeled) Transfers: Sit to/from Stand Sit to Stand: Min guard         General transfer comment: cues for hand placement, more difficulty from low toilet  Ambulation/Gait Ambulation/Gait assistance: Min guard Gait Distance (Feet): 80 Feet Assistive device: Rolling walker (2 wheeled) Gait Pattern/deviations: Step-to pattern;Step-through pattern     General Gait Details: cues for sequence   Stairs Stairs: Yes Stairs assistance: Min assist Stair Management: Backwards;With walker Number of Stairs: 2 General stair comments: cues for sequence   Wheelchair Mobility    Modified Rankin (Stroke Patients Only)       Balance   Sitting-balance support: Bilateral upper extremity supported;Feet supported Sitting balance-Leahy Scale: Good     Standing balance support: During functional activity;Bilateral upper extremity supported Standing balance-Leahy Scale: Good                              Cognition Arousal/Alertness: Awake/alert Behavior During Therapy: WFL for tasks assessed/performed;Restless Overall Cognitive Status: Within Functional Limits for tasks assessed                                         Exercises    General Comments        Pertinent Vitals/Pain Pain Score: 8  Faces Pain Scale: Hurts whole lot Pain Location: feet and toes from nueropathy, knee 3 Pain Descriptors / Indicators: Grimacing;Burning;Moaning;Nagging Pain Intervention(s): Monitored during session;Premedicated before session;Repositioned;Ice applied    Home Living                      Prior Function            PT Goals (current goals can now be found in the care plan section) Progress towards PT goals: Progressing toward goals    Frequency    7X/week      PT Plan Current plan remains appropriate    Co-evaluation              AM-PAC PT "6 Clicks" Mobility   Outcome Measure  Help needed turning from your back to your side while in a flat bed without using bedrails?: None Help needed moving from lying on your back to sitting on the side of a flat bed without using bedrails?: None Help needed moving to and from a bed to a chair (including a wheelchair)?: A Little Help needed standing up from a chair using your arms (e.g., wheelchair or bedside chair)?: A Little Help needed to walk in hospital room?: A Little Help needed climbing 3-5 steps  with a railing? : A Little 6 Click Score: 20    End of Session Equipment Utilized During Treatment: Gait belt Activity Tolerance: Patient tolerated treatment well Patient left: with call bell/phone within reach;in bed;with bed alarm set Nurse Communication: Mobility status PT Visit Diagnosis: Unsteadiness on feet (R26.81);Difficulty in walking, not elsewhere classified (R26.2);Pain Pain - Right/Left: Right Pain - part of body: Ankle and joints of foot     Time: 1594-5859 PT Time Calculation (min) (ACUTE ONLY): 32 min  Charges:  $Gait Training: 8-22 mins $Therapeutic Exercise: 8-22 mins $Self Care/Home Management: 8-22                     Blanchard Kelch PT Acute Rehabilitation Services Pager  [phone] Office [phone]    Rada Hay 09/26/2020, 4:21 PM

## 2020-09-26 NOTE — Progress Notes (Signed)
Physical Therapy Treatment Patient Details Name: April Jones MRN: [id number] DOB: 09-Sep-1946 Today's Date: 09/26/2020    History of Present Illness S/P RTKA, H/O DM and neuropathy    PT Comments    Patient performed HEP for TKA, practiced 2 steps backward. Patient not DC'ing today due to abn labs. Will review steps with patient and daughter tomorrow.   Follow Up Recommendations  Follow surgeon's recommendation for DC plan and follow-up therapies;Outpatient PT     Equipment Recommendations  3in1 (PT)    Recommendations for Other Services       Precautions / Restrictions Precautions Precautions: Fall;Knee    Mobility  Bed Mobility   Bed Mobility: Supine to Sit     Supine to sit: Supervision;HOB elevated        Transfers   Equipment used: Rolling walker (2 wheeled) Transfers: Sit to/from Stand Sit to Stand: Min guard         General transfer comment: cues for hand placement  Ambulation/Gait Ambulation/Gait assistance: Min guard Gait Distance (Feet): 25 Feet (x 2) Assistive device: Rolling walker (2 wheeled) Gait Pattern/deviations: Step-to pattern;Step-through pattern     General Gait Details: cues for sequence   Stairs Stairs: Yes Stairs assistance: Min assist Stair Management: Backwards;With walker Number of Stairs: 2 General stair comments: cues for sequence   Wheelchair Mobility    Modified Rankin (Stroke Patients Only)       Balance   Sitting-balance support: Bilateral upper extremity supported;Feet supported Sitting balance-Leahy Scale: Good     Standing balance support: During functional activity;Bilateral upper extremity supported Standing balance-Leahy Scale: Good                              Cognition Arousal/Alertness: Awake/alert Behavior During Therapy: WFL for tasks assessed/performed Overall Cognitive Status: Within Functional Limits for tasks assessed                                         Exercises Total Joint Exercises Ankle Circles/Pumps: AROM;Both;10 reps;Supine Quad Sets: AROM;Both;10 reps;Supine Heel Slides: AAROM;Right;10 reps;Supine Hip ABduction/ADduction: AROM;Right;10 reps;Supine Straight Leg Raises: AROM;Right;10 reps;Supine Long Arc Quad: AROM;Right;10 reps;Supine Goniometric ROM: 5-70 right knee    General Comments        Pertinent Vitals/Pain Pain Score: 2  Faces Pain Scale: Hurts whole lot Pain Location: right knee is a 2, feet are 8 Pain Descriptors / Indicators: Grimacing;Burning Pain Intervention(s): Monitored during session;Premedicated before session;Ice applied    Home Living                      Prior Function            PT Goals (current goals can now be found in the care plan section) Progress towards PT goals: Progressing toward goals    Frequency    7X/week      PT Plan Current plan remains appropriate    Co-evaluation              AM-PAC PT "6 Clicks" Mobility   Outcome Measure  Help needed turning from your back to your side while in a flat bed without using bedrails?: None Help needed moving from lying on your back to sitting on the side of a flat bed without using bedrails?: None Help needed moving to and from a bed to a chair (  including a wheelchair)?: A Little Help needed standing up from a chair using your arms (e.g., wheelchair or bedside chair)?: A Little Help needed to walk in hospital room?: A Little Help needed climbing 3-5 steps with a railing? : A Little 6 Click Score: 20    End of Session Equipment Utilized During Treatment: Gait belt Activity Tolerance: Patient tolerated treatment well Patient left: in chair;with call bell/phone within reach;with chair alarm set;with nursing/sitter in room Nurse Communication: Mobility status PT Visit Diagnosis: Unsteadiness on feet (R26.81);Difficulty in walking, not elsewhere classified (R26.2);Pain Pain - Right/Left: Right Pain - part of  body: Knee     Time: 8295-6213 PT Time Calculation (min) (ACUTE ONLY): 53 min  Charges:  $Gait Training: 8-22 mins $Therapeutic Exercise: 8-22 mins $Self Care/Home Management: 8-22                     Blanchard Kelch PT Acute Rehabilitation Services Pager [phone] Office [phone]    Rada Hay 09/26/2020, 2:18 PM

## 2020-09-26 NOTE — Discharge Summary (Addendum)
Physician Discharge Summary  Patient ID: April Jones MRN: [id number] DOB/AGE: Oct 09, 1946 74 y.o.  Admit date: 09/25/2020 Discharge date: 09/27/2020  Admission Diagnoses:  Osteoarthritis of right knee  Discharge Diagnoses:  Active Problems:   Osteoarthritis of right knee   Past Medical History:  Diagnosis Date   Arthritis    Diabetes mellitus without complication (HCC)    type 2    Hypertension     Surgeries: Procedure(s): COMPUTER ASSISTED TOTAL KNEE ARTHROPLASTY on 09/25/2020   Consultants (if any):    Discharged Condition: Improved  Hospital Course: April Jones is an 74 y.o. female who was admitted 09/25/2020 with a diagnosis of osteoarthritis of right knee and went to the operating room on 09/25/2020 and underwent the above named procedures.    She was given perioperative antibiotics:  Anti-infectives (From admission, onward)    Start     Dose/Rate Route Frequency Ordered Stop   09/25/20 1415  ceFAZolin (ANCEF) IVPB 2g/100 mL premix        2 g 200 mL/hr over 30 Minutes Intravenous Every 6 hours 09/25/20 1148 09/25/20 2047   09/25/20 1145  ceFAZolin (ANCEF) IVPB 2g/100 mL premix  Status:  Discontinued        2 g 200 mL/hr over 30 Minutes Intravenous Every 6 hours 09/25/20 1139 09/25/20 1148   09/25/20 0630  ceFAZolin (ANCEF) IVPB 2g/100 mL premix        2 g 200 mL/hr over 30 Minutes Intravenous On call to O.R. 09/25/20 8182 09/25/20 0848     .  She was given sequential compression devices, early ambulation, and aspirin for DVT prophylaxis.  She benefited maximally from the hospital stay and there were no complications.    Recent vital signs:  Vitals:   09/26/20 0905 09/26/20 1302  BP: 120/70 115/70  Pulse: 92 87  Resp: 16 18  Temp: 98.5 F (36.9 C) 98.1 F (36.7 C)  SpO2: 96% 99%    Recent laboratory studies:  Lab Results  Component Value Date   HGB 10.2 (L) 09/26/2020   HGB 12.9 09/12/2020   Lab Results  Component Value Date    WBC 11.7 (H) 09/26/2020   PLT 395 09/26/2020   Lab Results  Component Value Date   INR 0.9 09/12/2020   Lab Results  Component Value Date   NA 137 09/26/2020   K 4.4 09/26/2020   CL 104 09/26/2020   CO2 24 09/26/2020   BUN 19 09/26/2020   CREATININE 1.03 (H) 09/26/2020   GLUCOSE 163 (H) 09/26/2020    Discharge Medications:   Allergies as of 09/26/2020   No Known Allergies      Medication List     TAKE these medications    Accu-Chek Aviva Plus test strip Generic drug: glucose blood USE TO CHECK BLOOD SUGARS BID   amLODipine 5 MG tablet Commonly known as: NORVASC Take 5 mg by mouth daily.   aspirin 81 MG chewable tablet Commonly known as: Aspirin Childrens Chew 1 tablet (81 mg total) by mouth 2 (two) times daily with a meal.   atorvastatin 10 MG tablet Commonly known as: LIPITOR Take 10 mg by mouth daily.   calcipotriene 0.005 % ointment Commonly known as: DOVONOX Apply 1 application topically 2 (two) times daily as needed (psoriasis).   celecoxib 100 MG capsule Commonly known as: CELEBREX Take 100 mg by mouth daily.   cholecalciferol 25 MCG (1000 UNIT) tablet Commonly known as: VITAMIN D Take 1,000 Units by mouth daily.  clobetasol ointment 0.05 % Commonly known as: TEMOVATE Apply 1 application topically 2 (two) times daily as needed (psoriasis).   docusate sodium 100 MG capsule Commonly known as: COLACE Take 1 capsule (100 mg total) by mouth 2 (two) times daily.   DULoxetine 60 MG capsule Commonly known as: CYMBALTA Take 60 mg by mouth in the morning and at bedtime.   furosemide 40 MG tablet Commonly known as: LASIX Take 40 mg by mouth daily.   gabapentin 400 MG capsule Commonly known as: NEURONTIN Take 400 mg by mouth at bedtime. What changed: Another medication with the same name was changed. Make sure you understand how and when to take each.   gabapentin 300 MG capsule Commonly known as: NEURONTIN TAKE 2 CAPSULES EVERY MORNING, 2  CAPSULES AT LUNCH, 3 CAPSULES AT BEDTIME What changed: See the new instructions.   HYDROcodone-acetaminophen 7.5-325 MG tablet Commonly known as: NORCO Take 1-2 tablets by mouth every 4 (four) hours as needed for up to 7 days for severe pain (pain score 7-10).   Jardiance 25 MG Tabs tablet Generic drug: empagliflozin Take 25 mg by mouth daily.   loratadine 10 MG tablet Commonly known as: CLARITIN Take 10 mg by mouth daily.   Magnesium 400 MG Tabs Take 400 mg by mouth daily. Bone Health   metFORMIN 1000 MG tablet Commonly known as: GLUCOPHAGE Take 1,000 mg by mouth in the morning and at bedtime. Morning & Afternoon   metoprolol succinate 100 MG 24 hr tablet Commonly known as: TOPROL-XL Take 100 mg by mouth daily.   omeprazole 20 MG capsule Commonly known as: PRILOSEC Take 20 mg by mouth daily.   ondansetron 4 MG tablet Commonly known as: ZOFRAN Take 1 tablet (4 mg total) by mouth every 6 (six) hours as needed for nausea.   Ozempic (1 MG/DOSE) 4 MG/3ML Sopn Generic drug: Semaglutide (1 MG/DOSE) Inject 1 mg into the skin every Thursday.   polyethylene glycol powder 17 GM/SCOOP powder Commonly known as: GLYCOLAX/MIRALAX Take 17 g by mouth at bedtime.   senna 8.6 MG Tabs tablet Commonly known as: SENOKOT Take 1 tablet (8.6 mg total) by mouth 2 (two) times daily.   vitamin B-12 1000 MCG tablet Commonly known as: CYANOCOBALAMIN Take 1,000 mcg by mouth daily.   Xiidra 5 % Soln Generic drug: Lifitegrast Place 1 drop into both eyes in the morning and at bedtime.        Diagnostic Studies: DG Knee Right Port  Result Date: 09/25/2020 CLINICAL DATA:  Status post right knee replacement. EXAM: PORTABLE RIGHT KNEE - 1-2 VIEW COMPARISON:  None. FINDINGS: The right femoral and tibial components appear to be well situated. Patellar component appears intact. Expected postoperative changes are seen in the soft tissues anteriorly. IMPRESSION: Status post right total knee  arthroplasty. Electronically Signed   By: Lupita Raider M.D.   On: 09/25/2020 12:17    Disposition: Discharge disposition: 01-Home or Self Care       Discharge Instructions     Call MD / Call 911   Complete by: As directed    If you experience chest pain or shortness of breath, CALL 911 and be transported to the hospital emergency room.  If you develope a fever above 101 F, pus (white drainage) or increased drainage or redness at the wound, or calf pain, call your surgeon's office.   Constipation Prevention   Complete by: As directed    Drink plenty of fluids.  Prune juice may be helpful.  You may use a stool softener, such as Colace (over the counter) 100 mg twice a day.  Use MiraLax (over the counter) for constipation as needed.   Diet - low sodium heart healthy   Complete by: As directed    Do not put a pillow under the knee. Place it under the heel.   Complete by: As directed    Driving restrictions   Complete by: As directed    No driving for 2 weeks   Increase activity slowly as tolerated   Complete by: As directed    Lifting restrictions   Complete by: As directed    No lifting for 2 weeks   TED hose   Complete by: As directed    Use stockings (TED hose) for 2 weeks on both leg(s).  You may remove them at night for sleeping.     Dental Antibiotics:  In most cases prophylactic antibiotics for Dental procdeures after total joint surgery are not necessary.  Exceptions are as follows:  1. History of prior total joint infection  2. Severely immunocompromised (Organ Transplant, cancer chemotherapy, Rheumatoid biologic meds such as Humera)  3. Poorly controlled diabetes (A1C &gt; 8.0, blood glucose over 200)  If you have one of these conditions, contact your surgeon for an antibiotic prescription, prior to your dental procedure.   Follow-up Information     Samson Frederic, MD. Go on 10/08/2020.   Specialty: Orthopedic Surgery Why: You are scheduled for a  post-operative appointment on 10-08-20 at 2:00 pm.  Contact information: [address] [place] [address] [phone]                  Signed: Darrick Grinder 09/26/2020, 1:32 PM

## 2020-09-26 NOTE — TOC Transition Note (Signed)
Transition of Care [facility]) - CM/SW Discharge Note   Patient Details  Name: April Jones MRN: [id number] Date of Birth: Dec 25, 1945  [facility] CM/SW Contact:  Clearance Coots, LCSW Phone Number: 09/26/2020, 11:20 AM   Clinical Narrative:   See Elmon Else CM note.   [employer] delivered 3 in 1 to the patient bedside.    Final next level of care: Home/Self Care Barriers to Discharge: Barriers Resolved   Patient Goals and CMS Choice        Discharge Placement                       Discharge Plan and Services                DME Arranged: 3-N-1 DME Agency: [employer]       HH Arranged: NA HH Agency: NA        Social Determinants of Health (SDOH) Interventions     Readmission Risk Interventions No flowsheet data found.

## 2020-09-27 DIAGNOSIS — I110 Hypertensive heart disease with heart failure: Secondary | ICD-10-CM | POA: Diagnosis present

## 2020-09-27 DIAGNOSIS — M25561 Pain in right knee: Secondary | ICD-10-CM | POA: Diagnosis present

## 2020-09-27 DIAGNOSIS — Z791 Long term (current) use of non-steroidal anti-inflammatories (NSAID): Secondary | ICD-10-CM | POA: Diagnosis not present

## 2020-09-27 DIAGNOSIS — Z96642 Presence of left artificial hip joint: Secondary | ICD-10-CM | POA: Diagnosis present

## 2020-09-27 DIAGNOSIS — Z79899 Other long term (current) drug therapy: Secondary | ICD-10-CM | POA: Diagnosis not present

## 2020-09-27 DIAGNOSIS — E1140 Type 2 diabetes mellitus with diabetic neuropathy, unspecified: Secondary | ICD-10-CM | POA: Diagnosis present

## 2020-09-27 DIAGNOSIS — I509 Heart failure, unspecified: Secondary | ICD-10-CM | POA: Diagnosis present

## 2020-09-27 DIAGNOSIS — Z7984 Long term (current) use of oral hypoglycemic drugs: Secondary | ICD-10-CM | POA: Diagnosis not present

## 2020-09-27 DIAGNOSIS — M1711 Unilateral primary osteoarthritis, right knee: Secondary | ICD-10-CM | POA: Diagnosis present

## 2020-09-27 LAB — BASIC METABOLIC PANEL
Anion gap: 8 (ref 5–15)
BUN: 20 mg/dL (ref 8–23)
CO2: 23 mmol/L (ref 22–32)
Calcium: 8.6 mg/dL — ABNORMAL LOW (ref 8.9–10.3)
Chloride: 106 mmol/L (ref 98–111)
Creatinine, Ser: 1.12 mg/dL — ABNORMAL HIGH (ref 0.44–1.00)
GFR, Estimated: 48 mL/min — ABNORMAL LOW (ref >60)
Glucose, Bld: 143 mg/dL — ABNORMAL HIGH (ref 70–99)
Potassium: 4.9 mmol/L (ref 3.5–5.1)
Sodium: 137 mmol/L (ref 135–145)

## 2020-09-27 LAB — CBC
HCT: 29.1 % — ABNORMAL LOW (ref 36.0–46.0)
Hemoglobin: 9.3 g/dL — ABNORMAL LOW (ref 12.0–15.0)
MCH: 30 pg (ref 26.0–34.0)
MCHC: 32 g/dL (ref 30.0–36.0)
MCV: 93.9 fL (ref 80.0–100.0)
Platelets: 351 10*3/uL (ref 150–400)
RBC: 3.1 MIL/uL — ABNORMAL LOW (ref 3.87–5.11)
RDW: 14.7 % (ref 11.5–15.5)
WBC: 10.2 10*3/uL (ref 4.0–10.5)
nRBC: 0 % (ref 0.0–0.2)

## 2020-09-27 LAB — GLUCOSE, CAPILLARY: Glucose-Capillary: 102 mg/dL — ABNORMAL HIGH (ref 70–99)

## 2020-09-27 NOTE — Progress Notes (Signed)
Physical Therapy Treatment Patient Details Name: April Jones MRN: [id number] DOB: [birth year] Today's Date: 09/27/2020    History of Present Illness S/P RTKA, H/O DM and neuropathy    PT Comments    Patient ambulkated and been instructed in 1 step/curb. Patient is ready for DC.   Follow Up Recommendations  Follow surgeon's recommendation for DC plan and follow-up therapies;Outpatient PT     Equipment Recommendations  3in1 (PT)    Recommendations for Other Services       Precautions / Restrictions Precautions Precautions: Fall;Knee Restrictions Weight Bearing Restrictions: No RLE Weight Bearing: Weight bearing as tolerated    Mobility  Bed Mobility               General bed mobility comments: in recliner  Transfers   Equipment used: Rolling walker (2 wheeled) Transfers: Sit to/from Stand Sit to Stand: Supervision            Ambulation/Gait Ambulation/Gait assistance: Supervision Gait Distance (Feet): 40 Feet (x 2) Assistive device: Rolling walker (2 wheeled) Gait Pattern/deviations: Step-to pattern;Step-through pattern     General Gait Details: cues for sequence   Stairs   Stairs assistance: Min assist Stair Management: Backwards;With walker Number of Stairs: 2 General stair comments: cues for sequence, then 1 step forward  x 2 prctices   Wheelchair Mobility    Modified Rankin (Stroke Patients Only)       Balance                                            Cognition Arousal/Alertness: Awake/alert                                            Exercises Total Joint Exercises Ankle Circles/Pumps: AROM;Both;10 reps;Supine Quad Sets: AROM;Both;10 reps;Supine Heel Slides: AAROM;Right;10 reps;Supine Hip ABduction/ADduction: AROM;Right;10 reps;Supine Straight Leg Raises: AROM;Right;10 reps;Supine Long Arc Quad: AROM;Right;10 reps;Seated Knee Flexion: AROM;Right;10 reps;Seated    General  Comments        Pertinent Vitals/Pain Faces Pain Scale: Hurts little more Pain Location: feet and toes from nueropathy, knee 3 Pain Descriptors / Indicators: Grimacing;Burning;Moaning;Nagging    Home Living                      Prior Function            PT Goals (current goals can now be found in the care plan section) Progress towards PT goals: Progressing toward goals    Frequency    7X/week      PT Plan Current plan remains appropriate    Co-evaluation              AM-PAC PT "6 Clicks" Mobility   Outcome Measure  Help needed turning from your back to your side while in a flat bed without using bedrails?: None Help needed moving from lying on your back to sitting on the side of a flat bed without using bedrails?: None Help needed moving to and from a bed to a chair (including a wheelchair)?: A Little Help needed standing up from a chair using your arms (e.g., wheelchair or bedside chair)?: A Little Help needed to walk in hospital room?: A Little Help needed climbing 3-5 steps with a railing? : A Little 6  Click Score: 20    End of Session Equipment Utilized During Treatment: Gait belt Activity Tolerance: Patient tolerated treatment well Patient left: with call bell/phone within reach;with bed alarm set;in chair Nurse Communication: Mobility status PT Visit Diagnosis: Unsteadiness on feet (R26.81);Difficulty in walking, not elsewhere classified (R26.2);Pain Pain - Right/Left: Right Pain - part of body: Ankle and joints of foot     Time: 0950-1030 PT Time Calculation (min) (ACUTE ONLY): 40 min  Charges:  $Gait Training: 23-37 mins $Therapeutic Exercise: 8-22 mins                     Blanchard Kelch PT Acute Rehabilitation Services Pager [phone] Office [phone]    Rada Hay 09/27/2020, 11:25 AM

## 2020-09-27 NOTE — Progress Notes (Signed)
Pt provided with d/c instructions. After discussing the pt's plan of care upon d/c home, pt denied any further questions or concerns.  

## 2020-09-30 DIAGNOSIS — M25561 Pain in right knee: Secondary | ICD-10-CM | POA: Diagnosis not present

## 2020-10-03 DIAGNOSIS — M25561 Pain in right knee: Secondary | ICD-10-CM | POA: Diagnosis not present

## 2020-10-08 DIAGNOSIS — Z471 Aftercare following joint replacement surgery: Secondary | ICD-10-CM | POA: Diagnosis not present

## 2020-10-08 DIAGNOSIS — Z96651 Presence of right artificial knee joint: Secondary | ICD-10-CM | POA: Diagnosis not present

## 2020-10-08 DIAGNOSIS — M25561 Pain in right knee: Secondary | ICD-10-CM | POA: Diagnosis not present

## 2020-10-10 DIAGNOSIS — M25561 Pain in right knee: Secondary | ICD-10-CM | POA: Diagnosis not present

## 2020-10-12 DIAGNOSIS — M25561 Pain in right knee: Secondary | ICD-10-CM | POA: Diagnosis not present

## 2020-10-15 DIAGNOSIS — M25561 Pain in right knee: Secondary | ICD-10-CM | POA: Diagnosis not present

## 2020-10-17 DIAGNOSIS — M25561 Pain in right knee: Secondary | ICD-10-CM | POA: Diagnosis not present

## 2020-10-18 DIAGNOSIS — M25561 Pain in right knee: Secondary | ICD-10-CM | POA: Diagnosis not present

## 2020-10-22 DIAGNOSIS — M25561 Pain in right knee: Secondary | ICD-10-CM | POA: Diagnosis not present

## 2020-10-25 DIAGNOSIS — M25561 Pain in right knee: Secondary | ICD-10-CM | POA: Diagnosis not present

## 2020-10-25 DIAGNOSIS — M25661 Stiffness of right knee, not elsewhere classified: Secondary | ICD-10-CM | POA: Diagnosis not present

## 2020-10-28 DIAGNOSIS — M25561 Pain in right knee: Secondary | ICD-10-CM | POA: Diagnosis not present

## 2020-10-30 DIAGNOSIS — M25561 Pain in right knee: Secondary | ICD-10-CM | POA: Diagnosis not present

## 2020-11-04 DIAGNOSIS — M25561 Pain in right knee: Secondary | ICD-10-CM | POA: Diagnosis not present

## 2020-11-05 DIAGNOSIS — Z471 Aftercare following joint replacement surgery: Secondary | ICD-10-CM | POA: Diagnosis not present

## 2020-11-05 DIAGNOSIS — Z96651 Presence of right artificial knee joint: Secondary | ICD-10-CM | POA: Diagnosis not present

## 2020-11-12 DIAGNOSIS — M25561 Pain in right knee: Secondary | ICD-10-CM | POA: Diagnosis not present

## 2020-11-19 DIAGNOSIS — M25561 Pain in right knee: Secondary | ICD-10-CM | POA: Diagnosis not present

## 2020-11-22 DIAGNOSIS — M25561 Pain in right knee: Secondary | ICD-10-CM | POA: Diagnosis not present

## 2020-12-09 DIAGNOSIS — M25511 Pain in right shoulder: Secondary | ICD-10-CM | POA: Diagnosis not present

## 2020-12-20 DIAGNOSIS — M25511 Pain in right shoulder: Secondary | ICD-10-CM | POA: Diagnosis not present

## 2021-01-09 ENCOUNTER — Ambulatory Visit
Admission: RE | Admit: 2021-01-09 | Discharge: 2021-01-09 | Disposition: A | Discharge status: Home / Self Care | Payer: Medicare PPO | Source: Ambulatory Visit | Attending: Family Medicine | Admitting: Family Medicine

## 2021-01-09 ENCOUNTER — Other Ambulatory Visit: Payer: Self-pay

## 2021-01-09 ENCOUNTER — Other Ambulatory Visit: Payer: Medicare PPO

## 2021-01-09 DIAGNOSIS — Z1231 Encounter for screening mammogram for malignant neoplasm of breast: Secondary | ICD-10-CM | POA: Diagnosis not present

## 2021-02-10 DIAGNOSIS — E538 Deficiency of other specified B group vitamins: Secondary | ICD-10-CM | POA: Diagnosis not present

## 2021-02-10 DIAGNOSIS — E119 Type 2 diabetes mellitus without complications: Secondary | ICD-10-CM | POA: Diagnosis not present

## 2021-02-10 DIAGNOSIS — Z6833 Body mass index (BMI) 33.0-33.9, adult: Secondary | ICD-10-CM | POA: Diagnosis not present

## 2021-02-10 DIAGNOSIS — E1142 Type 2 diabetes mellitus with diabetic polyneuropathy: Secondary | ICD-10-CM | POA: Diagnosis not present

## 2021-02-10 DIAGNOSIS — E785 Hyperlipidemia, unspecified: Secondary | ICD-10-CM | POA: Diagnosis not present

## 2021-02-10 DIAGNOSIS — I10 Essential (primary) hypertension: Secondary | ICD-10-CM | POA: Diagnosis not present

## 2021-02-19 DIAGNOSIS — M5412 Radiculopathy, cervical region: Secondary | ICD-10-CM | POA: Diagnosis not present

## 2021-02-19 DIAGNOSIS — E1142 Type 2 diabetes mellitus with diabetic polyneuropathy: Secondary | ICD-10-CM | POA: Diagnosis not present

## 2021-02-19 DIAGNOSIS — R197 Diarrhea, unspecified: Secondary | ICD-10-CM | POA: Diagnosis not present

## 2021-02-19 DIAGNOSIS — R350 Frequency of micturition: Secondary | ICD-10-CM | POA: Diagnosis not present

## 2021-02-24 ENCOUNTER — Other Ambulatory Visit: Payer: Self-pay | Admitting: Family Medicine

## 2021-02-24 DIAGNOSIS — M5412 Radiculopathy, cervical region: Secondary | ICD-10-CM

## 2021-03-19 DIAGNOSIS — M1711 Unilateral primary osteoarthritis, right knee: Secondary | ICD-10-CM | POA: Diagnosis not present

## 2021-03-19 DIAGNOSIS — I10 Essential (primary) hypertension: Secondary | ICD-10-CM | POA: Diagnosis not present

## 2021-03-19 DIAGNOSIS — Z79899 Other long term (current) drug therapy: Secondary | ICD-10-CM | POA: Diagnosis not present

## 2021-03-19 DIAGNOSIS — L409 Psoriasis, unspecified: Secondary | ICD-10-CM | POA: Diagnosis not present

## 2021-03-19 DIAGNOSIS — G629 Polyneuropathy, unspecified: Secondary | ICD-10-CM | POA: Diagnosis not present

## 2021-03-19 DIAGNOSIS — M5412 Radiculopathy, cervical region: Secondary | ICD-10-CM | POA: Diagnosis not present

## 2021-03-19 DIAGNOSIS — E7800 Pure hypercholesterolemia, unspecified: Secondary | ICD-10-CM | POA: Diagnosis not present

## 2021-03-19 DIAGNOSIS — E1142 Type 2 diabetes mellitus with diabetic polyneuropathy: Secondary | ICD-10-CM | POA: Diagnosis not present

## 2021-03-24 ENCOUNTER — Ambulatory Visit
Admission: RE | Admit: 2021-03-24 | Discharge: 2021-03-24 | Disposition: A | Discharge status: Home / Self Care | Payer: Medicare PPO | Source: Ambulatory Visit | Attending: Family Medicine | Admitting: Family Medicine

## 2021-03-24 ENCOUNTER — Other Ambulatory Visit: Payer: Self-pay

## 2021-03-24 DIAGNOSIS — M50223 Other cervical disc displacement at C6-C7 level: Secondary | ICD-10-CM | POA: Diagnosis not present

## 2021-03-24 DIAGNOSIS — M50221 Other cervical disc displacement at C4-C5 level: Secondary | ICD-10-CM | POA: Diagnosis not present

## 2021-03-24 DIAGNOSIS — M47812 Spondylosis without myelopathy or radiculopathy, cervical region: Secondary | ICD-10-CM | POA: Diagnosis not present

## 2021-03-24 DIAGNOSIS — M5412 Radiculopathy, cervical region: Secondary | ICD-10-CM

## 2021-03-24 DIAGNOSIS — M4802 Spinal stenosis, cervical region: Secondary | ICD-10-CM | POA: Diagnosis not present

## 2021-03-30 ENCOUNTER — Emergency Department (HOSPITAL_COMMUNITY): Payer: Medicare PPO

## 2021-03-30 ENCOUNTER — Emergency Department (HOSPITAL_COMMUNITY)
Admission: EM | Admit: 2021-03-30 | Discharge: 2021-03-30 | Disposition: A | Discharge status: Home / Self Care | Payer: Medicare PPO | Attending: Emergency Medicine | Admitting: Emergency Medicine

## 2021-03-30 ENCOUNTER — Encounter (HOSPITAL_COMMUNITY): Payer: Self-pay

## 2021-03-30 DIAGNOSIS — Y9301 Activity, walking, marching and hiking: Secondary | ICD-10-CM | POA: Insufficient documentation

## 2021-03-30 DIAGNOSIS — E119 Type 2 diabetes mellitus without complications: Secondary | ICD-10-CM | POA: Insufficient documentation

## 2021-03-30 DIAGNOSIS — W01198A Fall on same level from slipping, tripping and stumbling with subsequent striking against other object, initial encounter: Secondary | ICD-10-CM | POA: Insufficient documentation

## 2021-03-30 DIAGNOSIS — Z96642 Presence of left artificial hip joint: Secondary | ICD-10-CM | POA: Insufficient documentation

## 2021-03-30 DIAGNOSIS — Y92009 Unspecified place in unspecified non-institutional (private) residence as the place of occurrence of the external cause: Secondary | ICD-10-CM | POA: Diagnosis not present

## 2021-03-30 DIAGNOSIS — M79642 Pain in left hand: Secondary | ICD-10-CM | POA: Diagnosis not present

## 2021-03-30 DIAGNOSIS — S199XXA Unspecified injury of neck, initial encounter: Secondary | ICD-10-CM | POA: Diagnosis not present

## 2021-03-30 DIAGNOSIS — I10 Essential (primary) hypertension: Secondary | ICD-10-CM | POA: Insufficient documentation

## 2021-03-30 DIAGNOSIS — Z7984 Long term (current) use of oral hypoglycemic drugs: Secondary | ICD-10-CM | POA: Insufficient documentation

## 2021-03-30 DIAGNOSIS — Z96651 Presence of right artificial knee joint: Secondary | ICD-10-CM | POA: Diagnosis not present

## 2021-03-30 DIAGNOSIS — S0990XA Unspecified injury of head, initial encounter: Secondary | ICD-10-CM | POA: Diagnosis not present

## 2021-03-30 DIAGNOSIS — W19XXXA Unspecified fall, initial encounter: Secondary | ICD-10-CM

## 2021-03-30 DIAGNOSIS — Z79899 Other long term (current) drug therapy: Secondary | ICD-10-CM | POA: Insufficient documentation

## 2021-03-30 MED ORDER — ACETAMINOPHEN 500 MG PO TABS: 1000.0000 mg | ORAL_TABLET | Freq: Four times a day (QID) | ORAL | 0 refills | Status: AC | PRN

## 2021-03-30 NOTE — ED Triage Notes (Signed)
Emergency Medicine Provider Triage Evaluation Note  April Jones , a 75 y.o. female  was evaluated in triage.  Pt complains of fall.  The patient reports that she tripped over a suitcase and fell backwards and hit the back of her head earlier tonight.  She is endorsing a headache and left thumb pain.  She is unsure sure of what she hit her left thumb 1.  No neck pain, chest pain, back pain, or extremity pain, numbness, or weakness.  No loss of consciousness.  She does not take blood thinners.  Review of Systems  Positive: Headache, left thumb pain Negative: Neck pain, chest pain, back pain, extremity pain, numbness, weakness, syncope  Physical Exam  BP (!) 131/95 (BP Location: Left Arm)   Pulse 96   Temp 98.2 F (36.8 C) (Oral)   Resp 17   LMP  (LMP Unknown)   SpO2 100%  Gen:   Awake, no distress   HEENT:  Hematoma noted to the occiput no crepitus or step-offs Resp:  Normal effort  Cardiac:  Normal rate  Abd:   Nondistended, nontender  MSK:   Moves extremities without difficulty  Neuro:  Speech clear   Medical Decision Making  Medically screening exam initiated at 2:55 AM.  Appropriate orders placed.  Erasmo Leventhal was informed that the remainder of the evaluation will be completed by another provider, this initial triage assessment does not replace that evaluation, and the importance of remaining in the ED until their evaluation is complete.  Clinical Impression  75 year old female who presents the emergency department after a mechanical fall after she tripped over a suitcase in the floor.  She fell and hit the back of her head, but did not have a syncopal episode.  She is endorsing headache and left thumb pain.  Will order CT head and cervical spine as well as x-ray of the left hand.  She will require further work-up and evaluation in the emergency department.   Frederik Pear A, PA-C 03/30/21 [phone]

## 2021-03-30 NOTE — ED Triage Notes (Signed)
Pt states that she tripped and fell, hit the back of her head, no LOC , not on thinners, some L thumb pain

## 2021-03-30 NOTE — ED Provider Notes (Signed)
[facility] EMERGENCY DEPARTMENT Provider Note   CSN: [id number] Arrival date & time: 03/30/21  0148     History Chief Complaint  Patient presents with  . Fall    April Jones is a 75 y.o. female.  The history is provided by the patient and medical records. No language interpreter was used.  Fall         75 year old female hx of DM, arthritis, hypertension, presenting for evaluation of a recent fall.  Patient report last night while talking to her granddaughter, she was walking, accidentally tripped over a suitcase on the ground and fell.  She is fell sideways, struck the left side of her head against the ground and perhaps struck her left hand as well.  She denies any loss of consciousness and was able to get up.  She does endorse some soreness to the left side of her head and her left thumb but otherwise she denies any precipitating symptoms prior to the fall her low confusion no nausea or vomiting no significant neck pain and chest pain or pain to her other extremities.  At this time symptom has improved.  She is not on any blood thinner medication.  She denies any weakness or numbness.  No complaint of back pain      Past Medical History:  Diagnosis Date  . Arthritis   . Diabetes mellitus without complication (HCC)    type 2   . Hypertension     Patient Active Problem List   Diagnosis Date Noted  . Unilateral primary osteoarthritis, left knee 08/21/2020  . Osteoarthritis of right knee 08/21/2020    Past Surgical History:  Procedure Laterality Date  . BREAST BIOPSY Right 1978  . BREAST EXCISIONAL BIOPSY Right   . ganglion cysts removal     . KNEE ARTHROPLASTY Right 09/25/2020   Procedure: COMPUTER ASSISTED TOTAL KNEE ARTHROPLASTY;  Surgeon: Samson Frederic, MD;  Location: [facility];  Service: Orthopedics;  Laterality: Right;  . left hip replacement    . right thigh surgery     . TONSILLECTOMY       OB History   No obstetric history on  file.     Family History  Problem Relation Age of Onset  . Breast cancer Neg Hx     Social History   Tobacco Use  . Smoking status: Never Smoker  . Smokeless tobacco: Never Used  Substance Use Topics  . Alcohol use: No  . Drug use: No    Home Medications Prior to Admission medications   Medication Sig Start Date End Date Taking? Authorizing Provider  ACCU-CHEK AVIVA PLUS test strip USE TO CHECK BLOOD SUGARS BID 03/01/18   [provider]  amLODipine (NORVASC) 5 MG tablet Take 5 mg by mouth daily.  04/29/20   [provider]  atorvastatin (LIPITOR) 10 MG tablet Take 10 mg by mouth daily.  03/08/17   [provider]  calcipotriene (DOVONOX) 0.005 % ointment Apply 1 application topically 2 (two) times daily as needed (psoriasis).  06/10/20   [provider]  celecoxib (CELEBREX) 100 MG capsule Take 100 mg by mouth daily. 07/17/20   [provider]  cholecalciferol (VITAMIN D) 25 MCG (1000 UNIT) tablet Take 1,000 Units by mouth daily.    [provider]  clobetasol ointment (TEMOVATE) 0.05 % Apply 1 application topically 2 (two) times daily as needed (psoriasis).  12/16/16   [provider]  docusate sodium (COLACE) 100 MG capsule Take  1 capsule (100 mg total) by mouth 2 (two) times daily. 09/26/20   Darrick Grinder, PA  DULoxetine (CYMBALTA) 60 MG capsule Take 60 mg by mouth in the morning and at bedtime.  05/25/20   [provider]  furosemide (LASIX) 40 MG tablet Take 40 mg by mouth daily.  11/12/15   [provider]  gabapentin (NEURONTIN) 300 MG capsule TAKE 2 CAPSULES EVERY MORNING, 2 CAPSULES AT LUNCH, 3 CAPSULES AT BEDTIME Patient taking differently: Take 300 mg by mouth in the morning and at bedtime.  07/07/18   Hyatt, Max T, DPM  gabapentin (NEURONTIN) 400 MG capsule Take 400 mg by mouth at bedtime.    [provider]  JARDIANCE 25 MG TABS tablet Take 25 mg by mouth daily.  03/04/18   [provider]  loratadine (CLARITIN) 10 MG tablet Take 10 mg by mouth daily.  12/04/15   [provider]  Magnesium 400 MG TABS Take 400 mg by mouth daily. Bone Health    [provider]  metFORMIN (GLUCOPHAGE) 1000 MG tablet Take 1,000 mg by mouth in the morning and at bedtime. Morning & Afternoon 11/12/15   [provider]  metoprolol succinate (TOPROL-XL) 100 MG 24 hr tablet Take 100 mg by mouth daily.  11/12/15   [provider]  omeprazole (PRILOSEC) 20 MG capsule Take 20 mg by mouth daily.  11/12/15   [provider]  ondansetron (ZOFRAN) 4 MG tablet Take 1 tablet (4 mg total) by mouth every 6 (six) hours as needed for nausea. 09/26/20   Darrick Grinder, PA  OZEMPIC, 1 MG/DOSE, 4 MG/3ML SOPN Inject 1 mg into the skin every Thursday. 07/01/20   [provider]  polyethylene glycol powder (GLYCOLAX/MIRALAX) powder Take 17 g by mouth at bedtime.  12/05/15   [provider]  senna (SENOKOT) 8.6 MG TABS tablet Take 1 tablet (8.6 mg total) by mouth 2 (two) times daily. 09/26/20   Darrick Grinder, PA  vitamin B-12 (CYANOCOBALAMIN) 1000 MCG tablet Take 1,000 mcg by mouth daily.    [provider]  XIIDRA 5 % SOLN Place 1 drop into both eyes in the morning and at bedtime.  12/13/16   [provider]    Allergies    Patient has no known allergies.  Review of Systems   Review of Systems  All other systems reviewed and are negative.   Physical Exam Updated Vital Signs BP (!) 141/86   Pulse 88   Temp 98.3 F (36.8 C)   Resp 16   LMP  (LMP Unknown)   SpO2 100%   Physical Exam Vitals and nursing note reviewed.  Constitutional:      General: She is not in acute distress.    Appearance: She is well-developed.  HENT:     Head: Atraumatic.  Eyes:     Conjunctiva/sclera: Conjunctivae normal.  Musculoskeletal:        General: Tenderness (Mild soreness to the left parietal scalp without any signs of  injury.) present.     Cervical back: Neck supple.     Comments: No significant midline spine tenderness.  5 out of 5 strength all 4 extremities.  Mild tenderness to left hand and left thumb without deformity.  Skin:    Findings: No rash.  Neurological:     Mental Status: She is alert and oriented to person, place, and time.  Psychiatric:        Mood and Affect: Mood normal.  ED Results / Procedures / Treatments   Labs (all labs ordered are listed, but only abnormal results are displayed) Labs Reviewed - No data to display  EKG None  Radiology CT Head Wo Contrast  Result Date: 03/30/2021 CLINICAL DATA:  Fall with posterior head injury EXAM: CT HEAD WITHOUT CONTRAST CT CERVICAL SPINE WITHOUT CONTRAST TECHNIQUE: Multidetector CT imaging of the head and cervical spine was performed following the standard protocol without intravenous contrast. Multiplanar CT image reconstructions of the cervical spine were also generated. COMPARISON:  Cervical MRI from 6 days ago FINDINGS: CT HEAD FINDINGS Brain: No evidence of acute infarction, hemorrhage, hydrocephalus, extra-axial collection or mass lesion/mass effect. Partially empty sella, incidental in this setting. Vascular: No hyperdense vessel or unexpected calcification. Skull: Left posterior scalp contusion.  No calvarial fracture. Sinuses/Orbits: No evidence of injury CT CERVICAL SPINE FINDINGS Alignment: Mild levocurvature Skull base and vertebrae: No acute fracture Soft tissues and spinal canal: No prevertebral fluid or swelling. No visible canal hematoma. Disc levels: Multilevel disc narrowing with endplate ridging. Degenerative changes were assessed by MRI recently. Upper chest: No evidence of injury IMPRESSION: 1. No evidence of acute intracranial or cervical spine injury. 2. Posterior scalp contusion without calvarial fracture. Electronically Signed   By: Marnee Spring M.D.   On: 03/30/2021 04:10   CT Cervical Spine Wo Contrast  Result  Date: 03/30/2021 CLINICAL DATA:  Fall with posterior head injury EXAM: CT HEAD WITHOUT CONTRAST CT CERVICAL SPINE WITHOUT CONTRAST TECHNIQUE: Multidetector CT imaging of the head and cervical spine was performed following the standard protocol without intravenous contrast. Multiplanar CT image reconstructions of the cervical spine were also generated. COMPARISON:  Cervical MRI from 6 days ago FINDINGS: CT HEAD FINDINGS Brain: No evidence of acute infarction, hemorrhage, hydrocephalus, extra-axial collection or mass lesion/mass effect. Partially empty sella, incidental in this setting. Vascular: No hyperdense vessel or unexpected calcification. Skull: Left posterior scalp contusion.  No calvarial fracture. Sinuses/Orbits: No evidence of injury CT CERVICAL SPINE FINDINGS Alignment: Mild levocurvature Skull base and vertebrae: No acute fracture Soft tissues and spinal canal: No prevertebral fluid or swelling. No visible canal hematoma. Disc levels: Multilevel disc narrowing with endplate ridging. Degenerative changes were assessed by MRI recently. Upper chest: No evidence of injury IMPRESSION: 1. No evidence of acute intracranial or cervical spine injury. 2. Posterior scalp contusion without calvarial fracture. Electronically Signed   By: Marnee Spring M.D.   On: 03/30/2021 04:10   DG Hand Complete Left  Result Date: 03/30/2021 CLINICAL DATA:  Fall, left hand pain EXAM: LEFT HAND - COMPLETE 3+ VIEW COMPARISON:  None. FINDINGS: Moderate to advanced degenerative changes in the IP joints and 1st carpometacarpal joint. No acute bony abnormality. Specifically, no fracture, subluxation, or dislocation. IMPRESSION: No acute bony abnormality. Electronically Signed   By: Charlett Nose M.D.   On: 03/30/2021 02:58    Procedures Procedures   Medications Ordered in ED Medications - No data to display  ED Course  I have reviewed the triage vital signs and the nursing notes.  Pertinent labs & imaging results that  were available during my care of the patient were reviewed by me and considered in my medical decision making (see chart for details).    MDM Rules/Calculators/A&P                          BP (!) 141/86   Pulse 88   Temp 98.3 F (36.8 C)   Resp 16  LMP  (LMP Unknown)   SpO2 100%   Final Clinical Impression(s) / ED Diagnoses Final diagnoses:  Fall at home, initial encounter  Minor head injury, initial encounter    Rx / DC Orders ED Discharge Orders         Ordered    acetaminophen (TYLENOL) 500 MG tablet  Every 6 hours PRN        03/30/21 0903         9:02 AM Patient had a mechanical fall without any loss of consciousness.  Some mild tenderness to the left side of his scalp only.  Head CT scan and cervical spine CT obtained without any acute finding.  She is stable for discharge   Otho Perlran, Amberlin Utke, PA-C 03/30/21 [id number]    Lorre NickAllen, Anthony, MD 04/01/21 [phone]

## 2021-04-01 DIAGNOSIS — Z96651 Presence of right artificial knee joint: Secondary | ICD-10-CM | POA: Diagnosis not present

## 2021-05-19 ENCOUNTER — Ambulatory Visit
Admission: RE | Admit: 2021-05-19 | Discharge: 2021-05-19 | Disposition: A | Discharge status: Home / Self Care | Payer: Medicare PPO | Source: Ambulatory Visit | Attending: Family Medicine | Admitting: Family Medicine

## 2021-05-19 ENCOUNTER — Other Ambulatory Visit: Payer: Self-pay

## 2021-05-19 DIAGNOSIS — E2839 Other primary ovarian failure: Secondary | ICD-10-CM

## 2021-05-19 DIAGNOSIS — M85851 Other specified disorders of bone density and structure, right thigh: Secondary | ICD-10-CM | POA: Diagnosis not present

## 2021-05-19 DIAGNOSIS — Z780 Asymptomatic menopausal state: Secondary | ICD-10-CM | POA: Diagnosis not present

## 2021-07-01 DIAGNOSIS — L400 Psoriasis vulgaris: Secondary | ICD-10-CM | POA: Diagnosis not present

## 2021-07-08 DIAGNOSIS — Z79899 Other long term (current) drug therapy: Secondary | ICD-10-CM | POA: Diagnosis not present

## 2021-07-08 DIAGNOSIS — L409 Psoriasis, unspecified: Secondary | ICD-10-CM | POA: Diagnosis not present

## 2021-08-04 DIAGNOSIS — E538 Deficiency of other specified B group vitamins: Secondary | ICD-10-CM | POA: Diagnosis not present

## 2021-08-04 DIAGNOSIS — E785 Hyperlipidemia, unspecified: Secondary | ICD-10-CM | POA: Diagnosis not present

## 2021-08-04 DIAGNOSIS — E1142 Type 2 diabetes mellitus with diabetic polyneuropathy: Secondary | ICD-10-CM | POA: Diagnosis not present

## 2021-08-11 DIAGNOSIS — E119 Type 2 diabetes mellitus without complications: Secondary | ICD-10-CM | POA: Diagnosis not present

## 2021-08-11 DIAGNOSIS — Z6835 Body mass index (BMI) 35.0-35.9, adult: Secondary | ICD-10-CM | POA: Diagnosis not present

## 2021-08-11 DIAGNOSIS — E785 Hyperlipidemia, unspecified: Secondary | ICD-10-CM | POA: Diagnosis not present

## 2021-08-11 DIAGNOSIS — E538 Deficiency of other specified B group vitamins: Secondary | ICD-10-CM | POA: Diagnosis not present

## 2021-08-11 DIAGNOSIS — I10 Essential (primary) hypertension: Secondary | ICD-10-CM | POA: Diagnosis not present

## 2021-08-11 DIAGNOSIS — E1142 Type 2 diabetes mellitus with diabetic polyneuropathy: Secondary | ICD-10-CM | POA: Diagnosis not present

## 2021-08-12 DIAGNOSIS — L400 Psoriasis vulgaris: Secondary | ICD-10-CM | POA: Diagnosis not present

## 2021-08-25 DIAGNOSIS — G629 Polyneuropathy, unspecified: Secondary | ICD-10-CM | POA: Diagnosis not present

## 2021-08-25 DIAGNOSIS — Z23 Encounter for immunization: Secondary | ICD-10-CM | POA: Diagnosis not present

## 2021-08-30 ENCOUNTER — Emergency Department (HOSPITAL_COMMUNITY)
Admission: EM | Admit: 2021-08-30 | Discharge: 2021-08-30 | Disposition: A | Discharge status: Home / Self Care | Payer: Medicare PPO | Attending: Emergency Medicine | Admitting: Emergency Medicine

## 2021-08-30 ENCOUNTER — Other Ambulatory Visit: Payer: Self-pay

## 2021-08-30 ENCOUNTER — Encounter (HOSPITAL_COMMUNITY): Payer: Self-pay

## 2021-08-30 DIAGNOSIS — I10 Essential (primary) hypertension: Secondary | ICD-10-CM | POA: Diagnosis not present

## 2021-08-30 DIAGNOSIS — E119 Type 2 diabetes mellitus without complications: Secondary | ICD-10-CM | POA: Insufficient documentation

## 2021-08-30 DIAGNOSIS — S0083XA Contusion of other part of head, initial encounter: Secondary | ICD-10-CM | POA: Insufficient documentation

## 2021-08-30 DIAGNOSIS — Z96651 Presence of right artificial knee joint: Secondary | ICD-10-CM | POA: Diagnosis not present

## 2021-08-30 DIAGNOSIS — Z96642 Presence of left artificial hip joint: Secondary | ICD-10-CM | POA: Insufficient documentation

## 2021-08-30 DIAGNOSIS — S0990XA Unspecified injury of head, initial encounter: Secondary | ICD-10-CM | POA: Diagnosis present

## 2021-08-30 DIAGNOSIS — M25571 Pain in right ankle and joints of right foot: Secondary | ICD-10-CM | POA: Insufficient documentation

## 2021-08-30 DIAGNOSIS — T07XXXA Unspecified multiple injuries, initial encounter: Secondary | ICD-10-CM

## 2021-08-30 DIAGNOSIS — Z7984 Long term (current) use of oral hypoglycemic drugs: Secondary | ICD-10-CM | POA: Diagnosis not present

## 2021-08-30 DIAGNOSIS — W06XXXA Fall from bed, initial encounter: Secondary | ICD-10-CM | POA: Diagnosis not present

## 2021-08-30 DIAGNOSIS — Z79899 Other long term (current) drug therapy: Secondary | ICD-10-CM | POA: Insufficient documentation

## 2021-08-30 DIAGNOSIS — M25561 Pain in right knee: Secondary | ICD-10-CM | POA: Insufficient documentation

## 2021-08-30 DIAGNOSIS — S8011XA Contusion of right lower leg, initial encounter: Secondary | ICD-10-CM | POA: Diagnosis not present

## 2021-08-30 NOTE — ED Triage Notes (Signed)
Pt reports she was having nightmare and fell out of her bed. Pt c/o pain to right leg, right ankle, and right arm. Pt did hit her head but denies any pain.

## 2021-08-30 NOTE — ED Provider Notes (Signed)
[facility]-EMERGENCY DEPT Provider Note   CSN: [id number] Arrival date & time: 08/30/21  3244     History Chief Complaint  Patient presents with   Rosalva Ferron Muriel Hannold is a 75 y.o. female.  75 year old female here after falling from her bed this morning after having a nightmare.  Did strike the left side of her head.  Does not take any blood thinners.  Complains of pain to her left forehead along with her right ankle and knee.  Able to ambulate appropriately.  Denies any confusion, nausea or vomiting.  No neck pain.  No chest or abdominal discomfort.  States that she feels sore all over      Past Medical History:  Diagnosis Date   Arthritis    Diabetes mellitus without complication (HCC)    type 2    Hypertension     Patient Active Problem List   Diagnosis Date Noted   Unilateral primary osteoarthritis, left knee 08/21/2020   Osteoarthritis of right knee 08/21/2020    Past Surgical History:  Procedure Laterality Date   BREAST BIOPSY Right 1978   BREAST EXCISIONAL BIOPSY Right    ganglion cysts removal      KNEE ARTHROPLASTY Right 09/25/2020   Procedure: COMPUTER ASSISTED TOTAL KNEE ARTHROPLASTY;  Surgeon: Samson Frederic, MD;  Location: [facility];  Service: Orthopedics;  Laterality: Right;   left hip replacement     right thigh surgery      TONSILLECTOMY       OB History   No obstetric history on file.     Family History  Problem Relation Age of Onset   Breast cancer Neg Hx     Social History   Tobacco Use   Smoking status: Never   Smokeless tobacco: Never  Substance Use Topics   Alcohol use: No   Drug use: No    Home Medications Prior to Admission medications   Medication Sig Start Date End Date Taking? Authorizing Provider  ACCU-CHEK AVIVA PLUS test strip USE TO CHECK BLOOD SUGARS BID 03/01/18   [provider]  acetaminophen (TYLENOL) 500 MG tablet Take 2 tablets (1,000 mg total) by mouth every 6 (six) hours as  needed for moderate pain. 03/30/21   Fayrene Helper, PA-C  amLODipine (NORVASC) 5 MG tablet Take 5 mg by mouth daily.  04/29/20   [provider]  atorvastatin (LIPITOR) 10 MG tablet Take 10 mg by mouth daily.  03/08/17   [provider]  calcipotriene (DOVONOX) 0.005 % ointment Apply 1 application topically 2 (two) times daily as needed (psoriasis).  06/10/20   [provider]  celecoxib (CELEBREX) 100 MG capsule Take 100 mg by mouth daily. 07/17/20   [provider]  cholecalciferol (VITAMIN D) 25 MCG (1000 UNIT) tablet Take 1,000 Units by mouth daily.    [provider]  clobetasol ointment (TEMOVATE) 0.05 % Apply 1 application topically 2 (two) times daily as needed (psoriasis).  12/16/16   [provider]  docusate sodium (COLACE) 100 MG capsule Take 1 capsule (100 mg total) by mouth 2 (two) times daily. 09/26/20   Darrick Grinder, PA  DULoxetine (CYMBALTA) 60 MG capsule Take 60 mg by mouth in the morning and at bedtime.  05/25/20   [provider]  furosemide (LASIX) 40 MG tablet Take 40 mg by mouth daily.  11/12/15   [provider]  gabapentin (NEURONTIN) 300 MG capsule TAKE 2 CAPSULES EVERY MORNING, 2 CAPSULES AT LUNCH,  3 CAPSULES AT BEDTIME Patient taking differently: Take 300 mg by mouth in the morning and at bedtime.  07/07/18   Hyatt, Max T, DPM  gabapentin (NEURONTIN) 400 MG capsule Take 400 mg by mouth at bedtime.    [provider]  JARDIANCE 25 MG TABS tablet Take 25 mg by mouth daily.  03/04/18   [provider]  loratadine (CLARITIN) 10 MG tablet Take 10 mg by mouth daily.  12/04/15   [provider]  Magnesium 400 MG TABS Take 400 mg by mouth daily. Bone Health    [provider]  metFORMIN (GLUCOPHAGE) 1000 MG tablet Take 1,000 mg by mouth in the morning and at bedtime. Morning & Afternoon 11/12/15   [provider]  metoprolol succinate (TOPROL-XL) 100 MG 24 hr tablet Take  100 mg by mouth daily.  11/12/15   [provider]  omeprazole (PRILOSEC) 20 MG capsule Take 20 mg by mouth daily.  11/12/15   [provider]  ondansetron (ZOFRAN) 4 MG tablet Take 1 tablet (4 mg total) by mouth every 6 (six) hours as needed for nausea. 09/26/20   Darrick Grinder, PA  OZEMPIC, 1 MG/DOSE, 4 MG/3ML SOPN Inject 1 mg into the skin every Thursday. 07/01/20   [provider]  polyethylene glycol powder (GLYCOLAX/MIRALAX) powder Take 17 g by mouth at bedtime.  12/05/15   [provider]  senna (SENOKOT) 8.6 MG TABS tablet Take 1 tablet (8.6 mg total) by mouth 2 (two) times daily. 09/26/20   Darrick Grinder, PA  vitamin B-12 (CYANOCOBALAMIN) 1000 MCG tablet Take 1,000 mcg by mouth daily.    [provider]  XIIDRA 5 % SOLN Place 1 drop into both eyes in the morning and at bedtime.  12/13/16   [provider]    Allergies    Patient has no known allergies.  Review of Systems   Review of Systems  All other systems reviewed and are negative.  Physical Exam Updated Vital Signs BP 133/82 (BP Location: Right Arm)   Pulse 96   Temp 98.3 F (36.8 C) (Oral)   Resp 16   Ht 1.575 m (5\' 2" )   Wt 86.2 kg   LMP  (LMP Unknown)   SpO2 100%   BMI 34.75 kg/m   Physical Exam Vitals and nursing note reviewed.  Constitutional:      General: She is not in acute distress.    Appearance: Normal appearance. She is well-developed. She is not toxic-appearing.  HENT:     Head:   Eyes:     General: Lids are normal.     Conjunctiva/sclera: Conjunctivae normal.     Pupils: Pupils are equal, round, and reactive to light.  Neck:     Thyroid: No thyroid mass.     Trachea: No tracheal deviation.  Cardiovascular:     Rate and Rhythm: Normal rate and regular rhythm.     Heart sounds: Normal heart sounds. No murmur heard.   No gallop.  Pulmonary:     Effort: Pulmonary effort is normal. No respiratory distress.     Breath sounds: Normal  breath sounds. No stridor. No decreased breath sounds, wheezing, rhonchi or rales.  Abdominal:     General: There is no distension.     Palpations: Abdomen is soft.     Tenderness: There is no abdominal tenderness. There is no rebound.  Musculoskeletal:        General: No tenderness. Normal range of motion.  Cervical back: Normal range of motion and neck supple.     Comments: Full range of motion in all 4 extremities.  No signs of deformity  Skin:    General: Skin is warm and dry.     Findings: No abrasion or rash.  Neurological:     Mental Status: She is alert and oriented to person, place, and time. Mental status is at baseline.     GCS: GCS eye subscore is 4. GCS verbal subscore is 5. GCS motor subscore is 6.     Cranial Nerves: Cranial nerves are intact. No cranial nerve deficit.     Sensory: No sensory deficit.     Motor: Motor function is intact.  Psychiatric:        Attention and Perception: Attention normal.        Speech: Speech normal.        Behavior: Behavior normal.    ED Results / Procedures / Treatments   Labs (all labs ordered are listed, but only abnormal results are displayed) Labs Reviewed - No data to display  EKG None  Radiology No results found.  Procedures Procedures   Medications Ordered in ED Medications - No data to display  ED Course  I have reviewed the triage vital signs and the nursing notes.  Pertinent labs & imaging results that were available during my care of the patient were reviewed by me and considered in my medical decision making (see chart for details).    MDM Rules/Calculators/A&P                           Patient with multiple contusions from fall from bed this morning.  No obvious indication for imaging at this time.  Discussed this with patient and she is comfortable with not imaging and monitoring her symptoms and will return if worse. Final Clinical Impression(s) / ED Diagnoses Final diagnoses:  None    Rx / DC  Orders ED Discharge Orders     None        Lorre Nick, MD 08/30/21 0800

## 2021-09-01 DIAGNOSIS — K219 Gastro-esophageal reflux disease without esophagitis: Secondary | ICD-10-CM | POA: Diagnosis not present

## 2021-09-01 DIAGNOSIS — L400 Psoriasis vulgaris: Secondary | ICD-10-CM | POA: Diagnosis not present

## 2021-09-01 DIAGNOSIS — Z6835 Body mass index (BMI) 35.0-35.9, adult: Secondary | ICD-10-CM | POA: Diagnosis not present

## 2021-09-01 DIAGNOSIS — E785 Hyperlipidemia, unspecified: Secondary | ICD-10-CM | POA: Diagnosis not present

## 2021-09-01 DIAGNOSIS — E1142 Type 2 diabetes mellitus with diabetic polyneuropathy: Secondary | ICD-10-CM | POA: Diagnosis not present

## 2021-09-01 DIAGNOSIS — G8929 Other chronic pain: Secondary | ICD-10-CM | POA: Diagnosis not present

## 2021-09-01 DIAGNOSIS — R609 Edema, unspecified: Secondary | ICD-10-CM | POA: Diagnosis not present

## 2021-09-01 DIAGNOSIS — I10 Essential (primary) hypertension: Secondary | ICD-10-CM | POA: Diagnosis not present

## 2021-09-08 DIAGNOSIS — D649 Anemia, unspecified: Secondary | ICD-10-CM | POA: Diagnosis not present

## 2021-09-08 DIAGNOSIS — R42 Dizziness and giddiness: Secondary | ICD-10-CM | POA: Diagnosis not present

## 2021-09-09 DIAGNOSIS — L900 Lichen sclerosus et atrophicus: Secondary | ICD-10-CM | POA: Diagnosis not present

## 2021-09-11 DIAGNOSIS — L400 Psoriasis vulgaris: Secondary | ICD-10-CM | POA: Diagnosis not present

## 2021-09-25 DIAGNOSIS — J069 Acute upper respiratory infection, unspecified: Secondary | ICD-10-CM | POA: Diagnosis not present

## 2021-09-29 DIAGNOSIS — Z96651 Presence of right artificial knee joint: Secondary | ICD-10-CM | POA: Diagnosis not present

## 2021-10-02 DIAGNOSIS — M5090 Cervical disc disorder, unspecified, unspecified cervical region: Secondary | ICD-10-CM | POA: Diagnosis not present

## 2021-10-02 DIAGNOSIS — E1142 Type 2 diabetes mellitus with diabetic polyneuropathy: Secondary | ICD-10-CM | POA: Diagnosis not present

## 2021-10-02 DIAGNOSIS — G629 Polyneuropathy, unspecified: Secondary | ICD-10-CM | POA: Diagnosis not present

## 2021-10-02 DIAGNOSIS — M170 Bilateral primary osteoarthritis of knee: Secondary | ICD-10-CM | POA: Diagnosis not present

## 2021-10-02 DIAGNOSIS — I10 Essential (primary) hypertension: Secondary | ICD-10-CM | POA: Diagnosis not present

## 2021-10-02 DIAGNOSIS — E7800 Pure hypercholesterolemia, unspecified: Secondary | ICD-10-CM | POA: Diagnosis not present

## 2021-10-02 DIAGNOSIS — Z0001 Encounter for general adult medical examination with abnormal findings: Secondary | ICD-10-CM | POA: Diagnosis not present

## 2021-10-02 DIAGNOSIS — Z79899 Other long term (current) drug therapy: Secondary | ICD-10-CM | POA: Diagnosis not present

## 2021-10-02 DIAGNOSIS — L409 Psoriasis, unspecified: Secondary | ICD-10-CM | POA: Diagnosis not present

## 2021-10-23 DIAGNOSIS — I10 Essential (primary) hypertension: Secondary | ICD-10-CM | POA: Diagnosis not present

## 2021-10-23 DIAGNOSIS — E1142 Type 2 diabetes mellitus with diabetic polyneuropathy: Secondary | ICD-10-CM | POA: Diagnosis not present

## 2021-10-23 DIAGNOSIS — M1711 Unilateral primary osteoarthritis, right knee: Secondary | ICD-10-CM | POA: Diagnosis not present

## 2021-10-23 DIAGNOSIS — E785 Hyperlipidemia, unspecified: Secondary | ICD-10-CM | POA: Diagnosis not present

## 2021-10-23 DIAGNOSIS — M170 Bilateral primary osteoarthritis of knee: Secondary | ICD-10-CM | POA: Diagnosis not present

## 2021-10-23 DIAGNOSIS — E7800 Pure hypercholesterolemia, unspecified: Secondary | ICD-10-CM | POA: Diagnosis not present

## 2021-10-28 DIAGNOSIS — R195 Other fecal abnormalities: Secondary | ICD-10-CM | POA: Diagnosis not present

## 2021-12-10 ENCOUNTER — Other Ambulatory Visit: Payer: Self-pay | Admitting: Family Medicine

## 2021-12-10 DIAGNOSIS — Z1231 Encounter for screening mammogram for malignant neoplasm of breast: Secondary | ICD-10-CM

## 2022-01-06 DIAGNOSIS — E785 Hyperlipidemia, unspecified: Secondary | ICD-10-CM | POA: Diagnosis not present

## 2022-01-06 DIAGNOSIS — E1142 Type 2 diabetes mellitus with diabetic polyneuropathy: Secondary | ICD-10-CM | POA: Diagnosis not present

## 2022-01-06 DIAGNOSIS — I10 Essential (primary) hypertension: Secondary | ICD-10-CM | POA: Diagnosis not present

## 2022-01-06 DIAGNOSIS — E7800 Pure hypercholesterolemia, unspecified: Secondary | ICD-10-CM | POA: Diagnosis not present

## 2022-01-12 ENCOUNTER — Ambulatory Visit
Admission: RE | Admit: 2022-01-12 | Discharge: 2022-01-12 | Disposition: A | Discharge status: Home / Self Care | Payer: Medicare PPO | Source: Ambulatory Visit | Attending: Family Medicine | Admitting: Family Medicine

## 2022-01-12 DIAGNOSIS — L400 Psoriasis vulgaris: Secondary | ICD-10-CM | POA: Diagnosis not present

## 2022-01-12 DIAGNOSIS — Z1231 Encounter for screening mammogram for malignant neoplasm of breast: Secondary | ICD-10-CM | POA: Diagnosis not present

## 2022-01-12 DIAGNOSIS — L298 Other pruritus: Secondary | ICD-10-CM | POA: Diagnosis not present

## 2022-01-14 DIAGNOSIS — M7989 Other specified soft tissue disorders: Secondary | ICD-10-CM | POA: Diagnosis not present

## 2022-02-04 DIAGNOSIS — G8929 Other chronic pain: Secondary | ICD-10-CM | POA: Diagnosis not present

## 2022-02-04 DIAGNOSIS — E11620 Type 2 diabetes mellitus with diabetic dermatitis: Secondary | ICD-10-CM | POA: Diagnosis not present

## 2022-02-04 DIAGNOSIS — H04129 Dry eye syndrome of unspecified lacrimal gland: Secondary | ICD-10-CM | POA: Diagnosis not present

## 2022-02-04 DIAGNOSIS — E1142 Type 2 diabetes mellitus with diabetic polyneuropathy: Secondary | ICD-10-CM | POA: Diagnosis not present

## 2022-02-04 DIAGNOSIS — I10 Essential (primary) hypertension: Secondary | ICD-10-CM | POA: Diagnosis not present

## 2022-02-04 DIAGNOSIS — E669 Obesity, unspecified: Secondary | ICD-10-CM | POA: Diagnosis not present

## 2022-02-04 DIAGNOSIS — L400 Psoriasis vulgaris: Secondary | ICD-10-CM | POA: Diagnosis not present

## 2022-02-04 DIAGNOSIS — E785 Hyperlipidemia, unspecified: Secondary | ICD-10-CM | POA: Diagnosis not present

## 2022-02-04 DIAGNOSIS — J309 Allergic rhinitis, unspecified: Secondary | ICD-10-CM | POA: Diagnosis not present

## 2022-02-06 DIAGNOSIS — L400 Psoriasis vulgaris: Secondary | ICD-10-CM | POA: Diagnosis not present

## 2022-02-16 DIAGNOSIS — L400 Psoriasis vulgaris: Secondary | ICD-10-CM | POA: Diagnosis not present

## 2022-02-28 IMAGING — MR MR CERVICAL SPINE W/O CM
4 of 5 series · 29 of 48 positions shown · non-contrast
Comparison: None.

CLINICAL DATA: Posterior and right side neck pain with limited
range of motion for 3-4 weeks.

EXAM:
MRI CERVICAL SPINE WITHOUT CONTRAST
TECHNIQUE: Multiplanar, multisequence MR imaging of the cervical spine was
performed. No intravenous contrast was administered.

[Series 2: T2 · sagittal · 3.0mm · 0.66mm/px · 8 of 18 slices shown (1 of 2)]
[im 1/18]
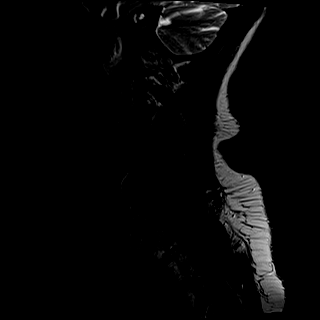
[im 3/18]
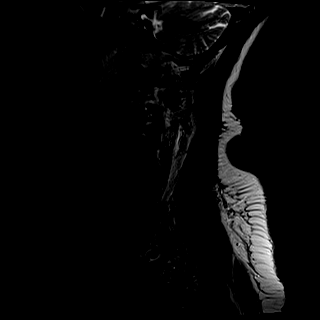
[im 5/18]
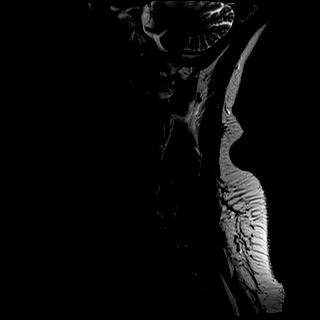
[im 8/18]
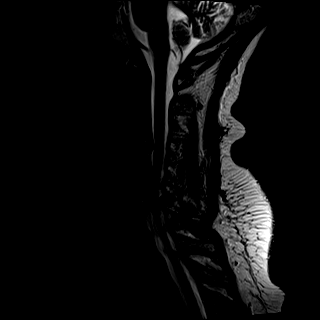
[im 10/18]
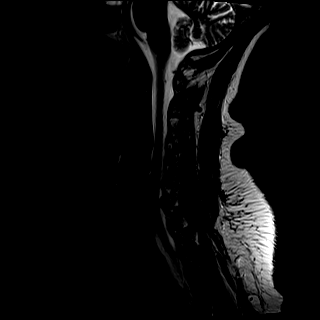
[im 13/18]
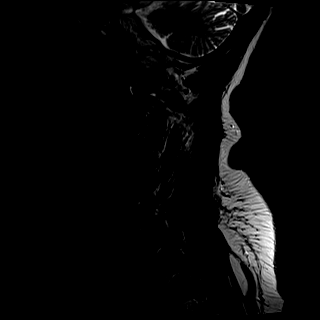
[im 15/18]
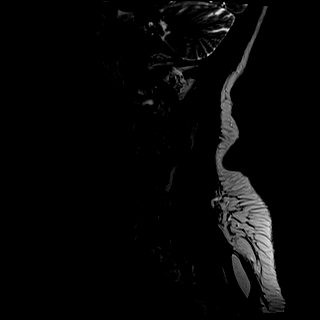
[im 18/18]
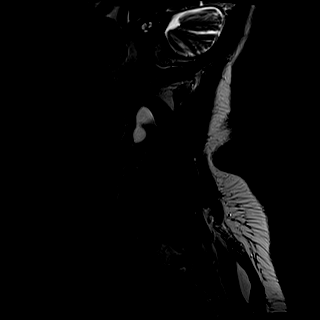

[Series 3: T1 · sagittal · 3.0mm · 0.41mm/px · 8 of 18 slices shown]
[im 1/18]
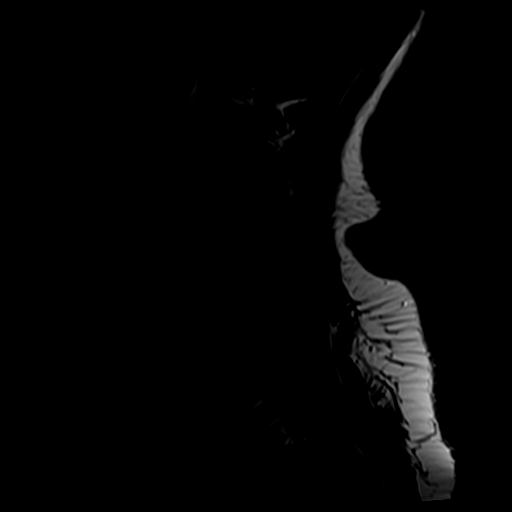
[im 3/18]
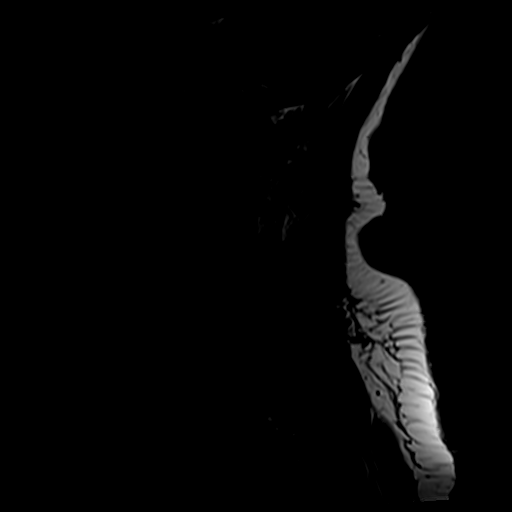
[im 5/18]
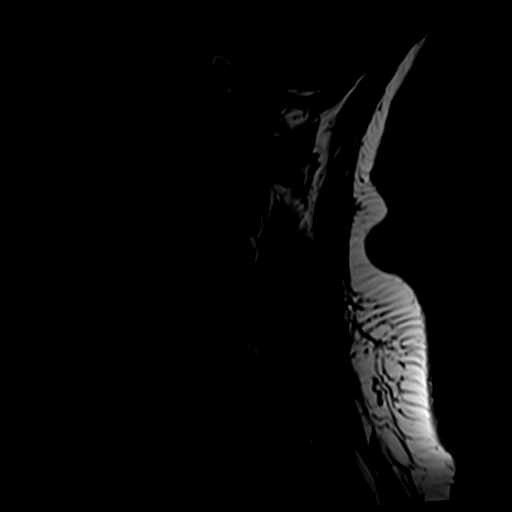
[im 8/18]
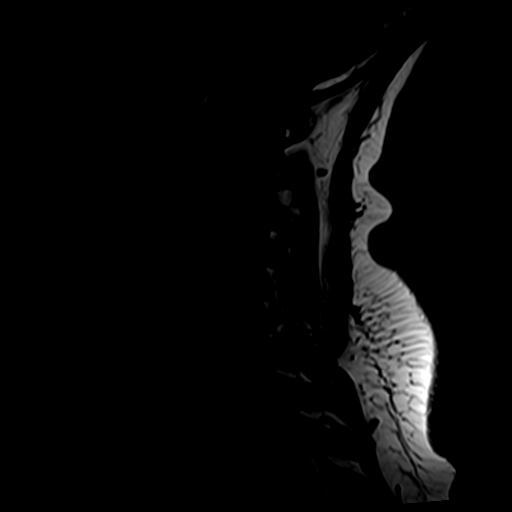
[im 10/18]
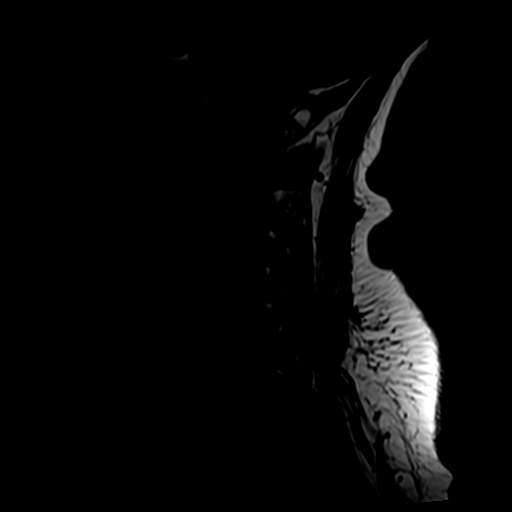
[im 13/18]
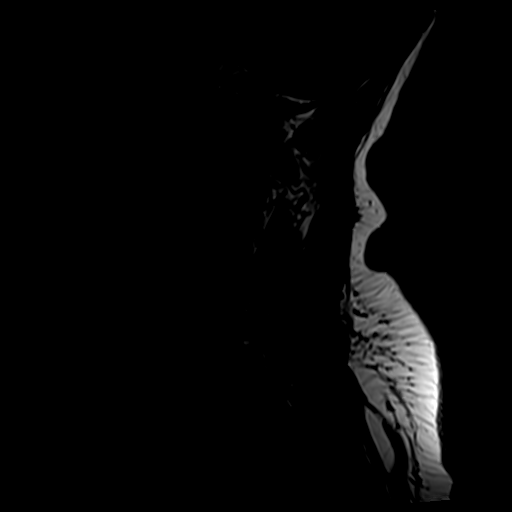
[im 15/18]
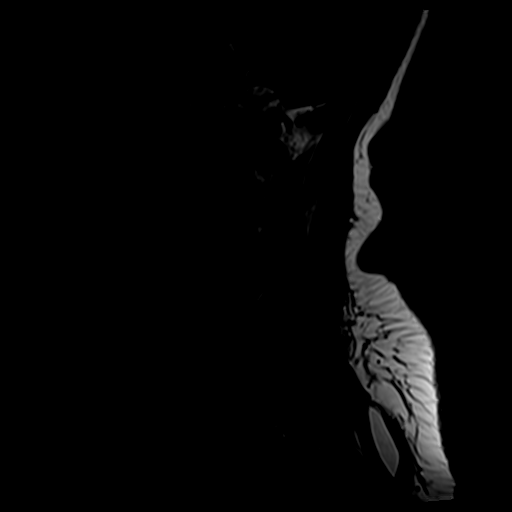
[im 18/18]
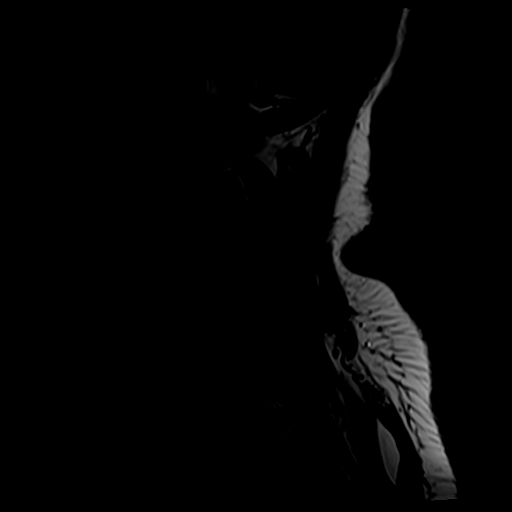

[Series 4: tir sag · sagittal · 3.0mm · 0.41mm/px · 4 of 18 slices shown]
[im 1/18]
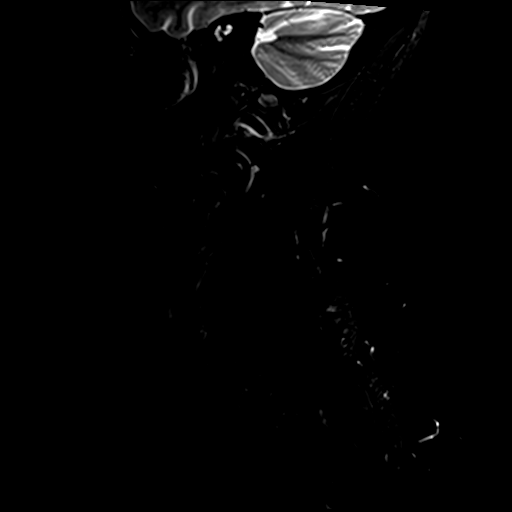
[im 3/18]
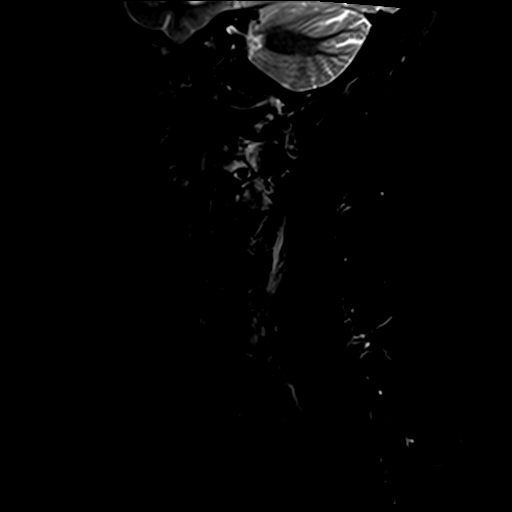
[im 10/18]
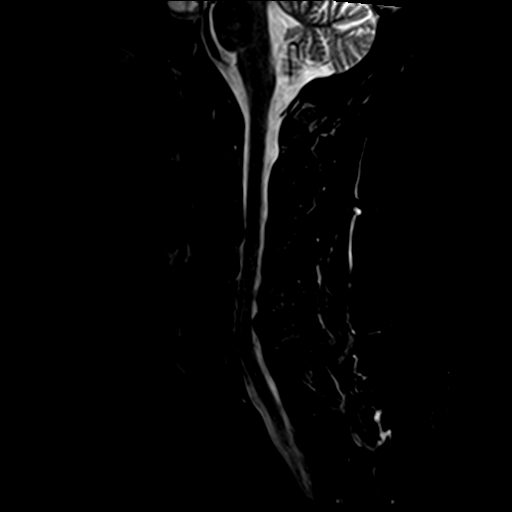
[im 15/18]
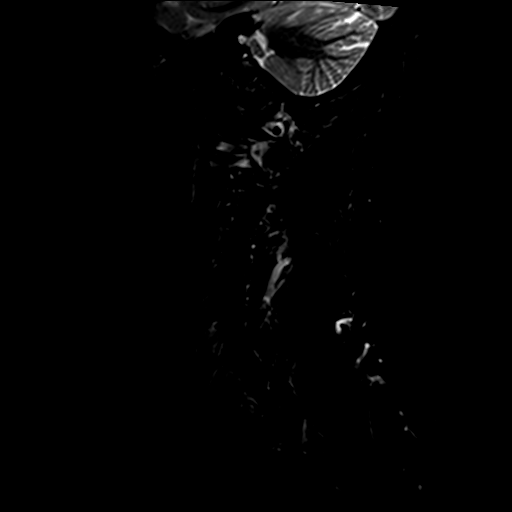

[Series 6: T2 · axial · 3.0mm · 0.70mm/px · z∈[-77,+17]mm · 9 of 27 slices shown (2 of 2)]
[im 1/27]
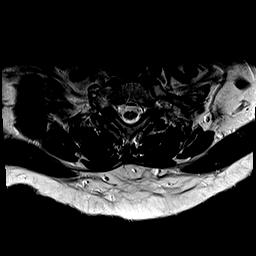
[im 5/27]
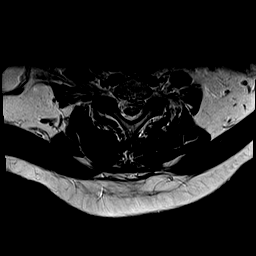
[im 8/27]
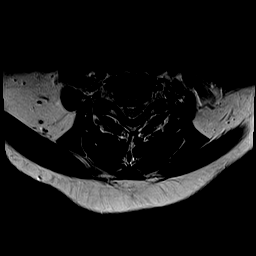
[im 12/27]
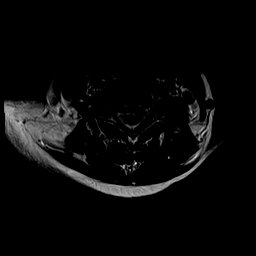
[im 15/27]
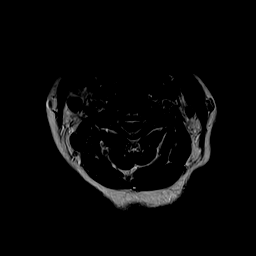
[im 19/27]
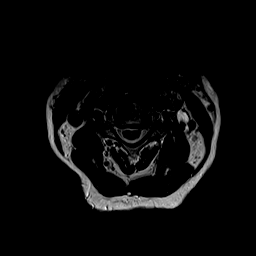
[im 22/27]
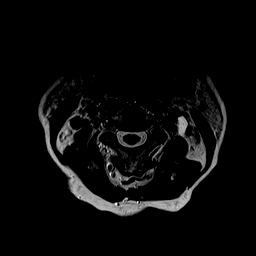
[im 24/27]
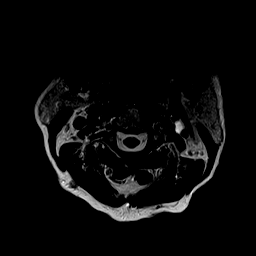
[im 27/27]
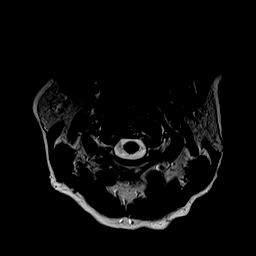

[29 of 48 positions shown; findings below may reference images not displayed]

FINDINGS: Alignment: There is straightening of the normal cervical lordosis
and trace retrolisthesis C4 on C5 and C5 on C6.

Vertebrae: No fracture, evidence of discitis, or bone lesion. Mild
degenerative endplate signal change is most notable at C3-4.

Cord: Normal signal throughout.

Posterior Fossa, vertebral arteries, paraspinal tissues: Negative.

Disc levels:

C2-3: Mild facet degenerative change.  Otherwise negative.

C3-4: Shallow disc bulge and bilateral uncovertebral disease. The
central canal is open. Mild to moderate foraminal narrowing is worse
on the left.

C4-5: Loss of disc space height with a shallow disc bulge and
bilateral uncovertebral disease. There is mild flattening of the
ventral cord. Moderately severe bilateral foraminal narrowing.

C5-6: Broad-based right paracentral protrusion and uncovertebral
disease on the right. The right hemicord is deformed and there is
severe right foraminal narrowing. The left foramen appears open.

C6-7: Broad-based right paracentral protrusion mildly deforms the
ventral aspect of the right cord. Mild to moderate right foraminal
narrowing. The left foramen is open.

C7-T1: Shallow left paracentral protrusion and bilateral facet
arthropathy. No stenosis.
IMPRESSION: Multilevel spondylosis as detailed above appears worst at C5-6 where
there is flattening of the right hemicord and severe right foraminal
narrowing.

Broad-based right paracentral protrusion at C6-7 mildly deforms the
ventral aspect of the right cord. Mild to moderate foraminal
narrowing is worse on the right at this level.

Mild flattening of the ventral cord at C4-5 where there is
moderately severe bilateral foraminal narrowing.

## 2022-03-17 DIAGNOSIS — L900 Lichen sclerosus et atrophicus: Secondary | ICD-10-CM | POA: Diagnosis not present

## 2022-03-25 DIAGNOSIS — E538 Deficiency of other specified B group vitamins: Secondary | ICD-10-CM | POA: Diagnosis not present

## 2022-03-25 DIAGNOSIS — E1142 Type 2 diabetes mellitus with diabetic polyneuropathy: Secondary | ICD-10-CM | POA: Diagnosis not present

## 2022-03-25 DIAGNOSIS — E785 Hyperlipidemia, unspecified: Secondary | ICD-10-CM | POA: Diagnosis not present

## 2022-03-25 DIAGNOSIS — I10 Essential (primary) hypertension: Secondary | ICD-10-CM | POA: Diagnosis not present

## 2022-03-25 DIAGNOSIS — Z6832 Body mass index (BMI) 32.0-32.9, adult: Secondary | ICD-10-CM | POA: Diagnosis not present

## 2022-04-02 DIAGNOSIS — G4733 Obstructive sleep apnea (adult) (pediatric): Secondary | ICD-10-CM | POA: Diagnosis not present

## 2022-04-02 DIAGNOSIS — E1142 Type 2 diabetes mellitus with diabetic polyneuropathy: Secondary | ICD-10-CM | POA: Diagnosis not present

## 2022-04-02 DIAGNOSIS — E7800 Pure hypercholesterolemia, unspecified: Secondary | ICD-10-CM | POA: Diagnosis not present

## 2022-04-02 DIAGNOSIS — M1991 Primary osteoarthritis, unspecified site: Secondary | ICD-10-CM | POA: Diagnosis not present

## 2022-04-02 DIAGNOSIS — I10 Essential (primary) hypertension: Secondary | ICD-10-CM | POA: Diagnosis not present

## 2022-04-02 DIAGNOSIS — Z79899 Other long term (current) drug therapy: Secondary | ICD-10-CM | POA: Diagnosis not present

## 2022-04-02 DIAGNOSIS — G629 Polyneuropathy, unspecified: Secondary | ICD-10-CM | POA: Diagnosis not present

## 2022-04-02 DIAGNOSIS — M5090 Cervical disc disorder, unspecified, unspecified cervical region: Secondary | ICD-10-CM | POA: Diagnosis not present

## 2022-04-08 DIAGNOSIS — E1142 Type 2 diabetes mellitus with diabetic polyneuropathy: Secondary | ICD-10-CM | POA: Diagnosis not present

## 2022-04-08 DIAGNOSIS — I10 Essential (primary) hypertension: Secondary | ICD-10-CM | POA: Diagnosis not present

## 2022-04-08 DIAGNOSIS — E785 Hyperlipidemia, unspecified: Secondary | ICD-10-CM | POA: Diagnosis not present

## 2022-04-21 DIAGNOSIS — E119 Type 2 diabetes mellitus without complications: Secondary | ICD-10-CM | POA: Diagnosis not present

## 2022-04-21 DIAGNOSIS — H25813 Combined forms of age-related cataract, bilateral: Secondary | ICD-10-CM | POA: Diagnosis not present

## 2022-05-14 DIAGNOSIS — G4733 Obstructive sleep apnea (adult) (pediatric): Secondary | ICD-10-CM | POA: Diagnosis not present

## 2022-05-14 DIAGNOSIS — I10 Essential (primary) hypertension: Secondary | ICD-10-CM | POA: Diagnosis not present

## 2022-05-27 DIAGNOSIS — G629 Polyneuropathy, unspecified: Secondary | ICD-10-CM | POA: Diagnosis not present

## 2022-06-02 DIAGNOSIS — E1142 Type 2 diabetes mellitus with diabetic polyneuropathy: Secondary | ICD-10-CM | POA: Diagnosis not present

## 2022-06-02 DIAGNOSIS — M79672 Pain in left foot: Secondary | ICD-10-CM | POA: Diagnosis not present

## 2022-06-24 ENCOUNTER — Ambulatory Visit (INDEPENDENT_AMBULATORY_CARE_PROVIDER_SITE_OTHER): Payer: Medicare PPO

## 2022-06-24 ENCOUNTER — Ambulatory Visit: Payer: Medicare PPO | Admitting: Podiatry

## 2022-06-24 DIAGNOSIS — B353 Tinea pedis: Secondary | ICD-10-CM

## 2022-06-24 MED ORDER — METHYLPREDNISOLONE 4 MG PO TBPK: ORAL_TABLET | ORAL | 0 refills | Status: DC

## 2022-06-24 MED ORDER — CLOTRIMAZOLE-BETAMETHASONE 1-0.05 % EX CREA: 1.0000 | TOPICAL_CREAM | Freq: Two times a day (BID) | CUTANEOUS | 1 refills | Status: AC

## 2022-06-24 NOTE — Progress Notes (Signed)
   HPI: 76 y.o. female presenting today as a new patient for evaluation of foot pain to the bilateral feet.  Patient states that about 3 weeks ago she began to experience pain and tenderness to the bilateral feet.  She is also experiencing significant itching with burning sensations to bilateral feet.  She currently takes 2800 mg gabapentin per day as well as Celebrex.  Denies a history of injury.  She presents for further treatment and evaluation  Past Medical History:  Diagnosis Date   Arthritis    Diabetes mellitus without complication (HCC)    type 2    Hypertension     Past Surgical History:  Procedure Laterality Date   BREAST BIOPSY Right 1978   BREAST EXCISIONAL BIOPSY Right    ganglion cysts removal      KNEE ARTHROPLASTY Right 09/25/2020   Procedure: COMPUTER ASSISTED TOTAL KNEE ARTHROPLASTY;  Surgeon: Samson Frederic, MD;  Location: [facility];  Service: Orthopedics;  Laterality: Right;   left hip replacement     right thigh surgery      TONSILLECTOMY      No Known Allergies   Physical Exam: General: The patient is alert and oriented x3 in no acute distress.  Dermatology: Skin is warm, dry and supple bilateral lower extremities. Negative for open lesions or macerations.  Pruritus with itching of skin noted to the bilateral feet  Vascular: Palpable pedal pulses bilaterally. Capillary refill within normal limits.  Negative for any significant edema or erythema  Neurological: Light touch and protective threshold grossly intact  Musculoskeletal Exam: No gross deformity or malalignment noted.  Generalized foot pain noted to the bilateral feet  Radiographic Exam:  Diffuse degenerative changes noted throughout the feet.  No acute fractures identified.  Assessment: 1.  Tinea pedis both 2.  Chronic DJD bilateral feet   Plan of Care:  1. Patient evaluated. X-Rays reviewed.  2.  Continue gabapentin as prescribed by PCP 3.  Prescription for Medrol Dosepak.  Then resume  Celebrex as prescribed by PCP 4.  Prescription for Lotrisone cream.  Apply 2 times daily 5.  Return to clinic 4 weeks     Felecia Shelling, DPM [facility]  Dr. Felecia Shelling, DPM    [address] [place], [place] [address]                Office [phone]  Fax [phone]

## 2022-06-29 ENCOUNTER — Telehealth: Payer: Self-pay

## 2022-06-29 NOTE — Telephone Encounter (Signed)
Patient walked in the office and states that the prescription methylprednisolone is not working and she would like to know if Dr. Logan Bores will send something else to pharmacy, please advise.

## 2022-06-30 ENCOUNTER — Telehealth: Payer: Self-pay | Admitting: *Deleted

## 2022-06-30 DIAGNOSIS — M79672 Pain in left foot: Secondary | ICD-10-CM | POA: Diagnosis not present

## 2022-06-30 DIAGNOSIS — M79671 Pain in right foot: Secondary | ICD-10-CM | POA: Diagnosis not present

## 2022-06-30 NOTE — Telephone Encounter (Signed)
Patient is calling for something else for neuropathy pain, cream  and the prednisone does not help. Please advise.

## 2022-07-03 NOTE — Telephone Encounter (Signed)
Unfortunately the patient is already on Celebrex and gabapentin.  I do not have any other medications that I can prescribe.  She can follow-up with her PCP.  Thanks, Dr. Logan Bores

## 2022-07-10 DIAGNOSIS — G629 Polyneuropathy, unspecified: Secondary | ICD-10-CM | POA: Diagnosis not present

## 2022-07-13 NOTE — Telephone Encounter (Signed)
I have tried to reach out to the patient to give instructions per Dr. Logan Bores.left a vm will try to call again.

## 2022-07-15 ENCOUNTER — Ambulatory Visit: Payer: Medicare PPO | Admitting: Podiatry

## 2022-09-04 DIAGNOSIS — E1142 Type 2 diabetes mellitus with diabetic polyneuropathy: Secondary | ICD-10-CM | POA: Diagnosis not present

## 2022-09-04 DIAGNOSIS — Z79899 Other long term (current) drug therapy: Secondary | ICD-10-CM | POA: Diagnosis not present

## 2022-09-04 DIAGNOSIS — I10 Essential (primary) hypertension: Secondary | ICD-10-CM | POA: Diagnosis not present

## 2022-09-04 DIAGNOSIS — G629 Polyneuropathy, unspecified: Secondary | ICD-10-CM | POA: Diagnosis not present

## 2022-09-04 DIAGNOSIS — E7800 Pure hypercholesterolemia, unspecified: Secondary | ICD-10-CM | POA: Diagnosis not present

## 2022-09-10 DIAGNOSIS — I10 Essential (primary) hypertension: Secondary | ICD-10-CM | POA: Diagnosis not present

## 2022-09-10 DIAGNOSIS — G4733 Obstructive sleep apnea (adult) (pediatric): Secondary | ICD-10-CM | POA: Diagnosis not present

## 2022-09-29 DIAGNOSIS — E118 Type 2 diabetes mellitus with unspecified complications: Secondary | ICD-10-CM | POA: Diagnosis not present

## 2022-09-29 DIAGNOSIS — E785 Hyperlipidemia, unspecified: Secondary | ICD-10-CM | POA: Diagnosis not present

## 2022-09-29 DIAGNOSIS — E1142 Type 2 diabetes mellitus with diabetic polyneuropathy: Secondary | ICD-10-CM | POA: Diagnosis not present

## 2022-09-29 DIAGNOSIS — I10 Essential (primary) hypertension: Secondary | ICD-10-CM | POA: Diagnosis not present

## 2022-09-29 DIAGNOSIS — N1831 Chronic kidney disease, stage 3a: Secondary | ICD-10-CM | POA: Diagnosis not present

## 2022-11-17 DIAGNOSIS — L400 Psoriasis vulgaris: Secondary | ICD-10-CM | POA: Diagnosis not present

## 2022-11-18 DIAGNOSIS — L400 Psoriasis vulgaris: Secondary | ICD-10-CM | POA: Diagnosis not present

## 2022-11-30 DIAGNOSIS — G4733 Obstructive sleep apnea (adult) (pediatric): Secondary | ICD-10-CM | POA: Diagnosis not present

## 2022-12-08 DIAGNOSIS — Z79899 Other long term (current) drug therapy: Secondary | ICD-10-CM | POA: Diagnosis not present

## 2022-12-08 DIAGNOSIS — I10 Essential (primary) hypertension: Secondary | ICD-10-CM | POA: Diagnosis not present

## 2022-12-08 DIAGNOSIS — G629 Polyneuropathy, unspecified: Secondary | ICD-10-CM | POA: Diagnosis not present

## 2022-12-08 DIAGNOSIS — E7800 Pure hypercholesterolemia, unspecified: Secondary | ICD-10-CM | POA: Diagnosis not present

## 2022-12-08 DIAGNOSIS — M1991 Primary osteoarthritis, unspecified site: Secondary | ICD-10-CM | POA: Diagnosis not present

## 2022-12-08 DIAGNOSIS — E1142 Type 2 diabetes mellitus with diabetic polyneuropathy: Secondary | ICD-10-CM | POA: Diagnosis not present

## 2022-12-08 DIAGNOSIS — Z0001 Encounter for general adult medical examination with abnormal findings: Secondary | ICD-10-CM | POA: Diagnosis not present

## 2022-12-08 DIAGNOSIS — M5090 Cervical disc disorder, unspecified, unspecified cervical region: Secondary | ICD-10-CM | POA: Diagnosis not present

## 2022-12-08 DIAGNOSIS — Z1331 Encounter for screening for depression: Secondary | ICD-10-CM | POA: Diagnosis not present

## 2022-12-15 ENCOUNTER — Other Ambulatory Visit: Payer: Self-pay | Admitting: Family Medicine

## 2022-12-15 DIAGNOSIS — Z1231 Encounter for screening mammogram for malignant neoplasm of breast: Secondary | ICD-10-CM

## 2022-12-16 DIAGNOSIS — G4733 Obstructive sleep apnea (adult) (pediatric): Secondary | ICD-10-CM | POA: Diagnosis not present

## 2022-12-26 DIAGNOSIS — G4733 Obstructive sleep apnea (adult) (pediatric): Secondary | ICD-10-CM | POA: Diagnosis not present

## 2023-01-26 DIAGNOSIS — G4733 Obstructive sleep apnea (adult) (pediatric): Secondary | ICD-10-CM | POA: Diagnosis not present

## 2023-01-28 ENCOUNTER — Encounter: Payer: Self-pay | Admitting: Podiatry

## 2023-02-01 ENCOUNTER — Ambulatory Visit
Admission: RE | Admit: 2023-02-01 | Discharge: 2023-02-01 | Disposition: A | Discharge status: Home / Self Care | Payer: Medicare PPO | Source: Ambulatory Visit | Attending: Family Medicine | Admitting: Family Medicine

## 2023-02-01 DIAGNOSIS — Z1231 Encounter for screening mammogram for malignant neoplasm of breast: Secondary | ICD-10-CM | POA: Diagnosis not present

## 2023-02-15 DIAGNOSIS — G4733 Obstructive sleep apnea (adult) (pediatric): Secondary | ICD-10-CM | POA: Diagnosis not present

## 2023-02-24 DIAGNOSIS — G629 Polyneuropathy, unspecified: Secondary | ICD-10-CM | POA: Diagnosis not present

## 2023-02-24 DIAGNOSIS — M1991 Primary osteoarthritis, unspecified site: Secondary | ICD-10-CM | POA: Diagnosis not present

## 2023-02-24 DIAGNOSIS — G4733 Obstructive sleep apnea (adult) (pediatric): Secondary | ICD-10-CM | POA: Diagnosis not present

## 2023-02-24 DIAGNOSIS — M5090 Cervical disc disorder, unspecified, unspecified cervical region: Secondary | ICD-10-CM | POA: Diagnosis not present

## 2023-02-24 DIAGNOSIS — I10 Essential (primary) hypertension: Secondary | ICD-10-CM | POA: Diagnosis not present

## 2023-02-24 DIAGNOSIS — E1142 Type 2 diabetes mellitus with diabetic polyneuropathy: Secondary | ICD-10-CM | POA: Diagnosis not present

## 2023-03-27 DIAGNOSIS — G4733 Obstructive sleep apnea (adult) (pediatric): Secondary | ICD-10-CM | POA: Diagnosis not present

## 2023-04-02 DIAGNOSIS — E118 Type 2 diabetes mellitus with unspecified complications: Secondary | ICD-10-CM | POA: Diagnosis not present

## 2023-04-02 DIAGNOSIS — E785 Hyperlipidemia, unspecified: Secondary | ICD-10-CM | POA: Diagnosis not present

## 2023-04-07 DIAGNOSIS — I10 Essential (primary) hypertension: Secondary | ICD-10-CM | POA: Diagnosis not present

## 2023-04-07 DIAGNOSIS — E785 Hyperlipidemia, unspecified: Secondary | ICD-10-CM | POA: Diagnosis not present

## 2023-04-07 DIAGNOSIS — E118 Type 2 diabetes mellitus with unspecified complications: Secondary | ICD-10-CM | POA: Diagnosis not present

## 2023-04-26 DIAGNOSIS — E119 Type 2 diabetes mellitus without complications: Secondary | ICD-10-CM | POA: Diagnosis not present

## 2023-04-26 DIAGNOSIS — G4733 Obstructive sleep apnea (adult) (pediatric): Secondary | ICD-10-CM | POA: Diagnosis not present

## 2023-04-26 DIAGNOSIS — H0014 Chalazion left upper eyelid: Secondary | ICD-10-CM | POA: Diagnosis not present

## 2023-04-27 DIAGNOSIS — L400 Psoriasis vulgaris: Secondary | ICD-10-CM | POA: Diagnosis not present

## 2023-04-29 DIAGNOSIS — I10 Essential (primary) hypertension: Secondary | ICD-10-CM | POA: Diagnosis not present

## 2023-04-29 DIAGNOSIS — M1991 Primary osteoarthritis, unspecified site: Secondary | ICD-10-CM | POA: Diagnosis not present

## 2023-04-29 DIAGNOSIS — E1142 Type 2 diabetes mellitus with diabetic polyneuropathy: Secondary | ICD-10-CM | POA: Diagnosis not present

## 2023-04-29 DIAGNOSIS — Z79899 Other long term (current) drug therapy: Secondary | ICD-10-CM | POA: Diagnosis not present

## 2023-04-29 DIAGNOSIS — E1165 Type 2 diabetes mellitus with hyperglycemia: Secondary | ICD-10-CM | POA: Diagnosis not present

## 2023-05-27 DIAGNOSIS — G4733 Obstructive sleep apnea (adult) (pediatric): Secondary | ICD-10-CM | POA: Diagnosis not present

## 2023-05-31 DIAGNOSIS — G4733 Obstructive sleep apnea (adult) (pediatric): Secondary | ICD-10-CM | POA: Diagnosis not present

## 2023-05-31 DIAGNOSIS — E538 Deficiency of other specified B group vitamins: Secondary | ICD-10-CM | POA: Diagnosis not present

## 2023-05-31 DIAGNOSIS — E1142 Type 2 diabetes mellitus with diabetic polyneuropathy: Secondary | ICD-10-CM | POA: Diagnosis not present

## 2023-05-31 DIAGNOSIS — M1990 Unspecified osteoarthritis, unspecified site: Secondary | ICD-10-CM | POA: Diagnosis not present

## 2023-05-31 DIAGNOSIS — K219 Gastro-esophageal reflux disease without esophagitis: Secondary | ICD-10-CM | POA: Diagnosis not present

## 2023-05-31 DIAGNOSIS — E785 Hyperlipidemia, unspecified: Secondary | ICD-10-CM | POA: Diagnosis not present

## 2023-05-31 DIAGNOSIS — J309 Allergic rhinitis, unspecified: Secondary | ICD-10-CM | POA: Diagnosis not present

## 2023-05-31 DIAGNOSIS — K08409 Partial loss of teeth, unspecified cause, unspecified class: Secondary | ICD-10-CM | POA: Diagnosis not present

## 2023-05-31 DIAGNOSIS — E669 Obesity, unspecified: Secondary | ICD-10-CM | POA: Diagnosis not present

## 2023-06-21 DIAGNOSIS — G4733 Obstructive sleep apnea (adult) (pediatric): Secondary | ICD-10-CM | POA: Diagnosis not present

## 2023-08-02 DIAGNOSIS — G4733 Obstructive sleep apnea (adult) (pediatric): Secondary | ICD-10-CM | POA: Diagnosis not present

## 2023-10-04 DIAGNOSIS — E118 Type 2 diabetes mellitus with unspecified complications: Secondary | ICD-10-CM | POA: Diagnosis not present

## 2023-10-06 DIAGNOSIS — G4733 Obstructive sleep apnea (adult) (pediatric): Secondary | ICD-10-CM | POA: Diagnosis not present

## 2023-10-06 DIAGNOSIS — G4721 Circadian rhythm sleep disorder, delayed sleep phase type: Secondary | ICD-10-CM | POA: Diagnosis not present

## 2023-10-11 DIAGNOSIS — N1831 Chronic kidney disease, stage 3a: Secondary | ICD-10-CM | POA: Diagnosis not present

## 2023-10-11 DIAGNOSIS — E118 Type 2 diabetes mellitus with unspecified complications: Secondary | ICD-10-CM | POA: Diagnosis not present

## 2023-10-11 DIAGNOSIS — E785 Hyperlipidemia, unspecified: Secondary | ICD-10-CM | POA: Diagnosis not present

## 2023-10-11 DIAGNOSIS — E1142 Type 2 diabetes mellitus with diabetic polyneuropathy: Secondary | ICD-10-CM | POA: Diagnosis not present

## 2023-10-11 DIAGNOSIS — I10 Essential (primary) hypertension: Secondary | ICD-10-CM | POA: Diagnosis not present

## 2023-11-02 DIAGNOSIS — R413 Other amnesia: Secondary | ICD-10-CM | POA: Diagnosis not present

## 2023-11-02 DIAGNOSIS — I10 Essential (primary) hypertension: Secondary | ICD-10-CM | POA: Diagnosis not present

## 2023-11-02 DIAGNOSIS — E1142 Type 2 diabetes mellitus with diabetic polyneuropathy: Secondary | ICD-10-CM | POA: Diagnosis not present

## 2023-11-02 DIAGNOSIS — Z79899 Other long term (current) drug therapy: Secondary | ICD-10-CM | POA: Diagnosis not present

## 2023-11-02 DIAGNOSIS — Z1331 Encounter for screening for depression: Secondary | ICD-10-CM | POA: Diagnosis not present

## 2023-11-02 DIAGNOSIS — N1831 Chronic kidney disease, stage 3a: Secondary | ICD-10-CM | POA: Diagnosis not present

## 2023-11-02 DIAGNOSIS — M1991 Primary osteoarthritis, unspecified site: Secondary | ICD-10-CM | POA: Diagnosis not present

## 2023-11-02 DIAGNOSIS — Z0001 Encounter for general adult medical examination with abnormal findings: Secondary | ICD-10-CM | POA: Diagnosis not present

## 2023-11-02 DIAGNOSIS — E7800 Pure hypercholesterolemia, unspecified: Secondary | ICD-10-CM | POA: Diagnosis not present

## 2023-11-02 DIAGNOSIS — E1122 Type 2 diabetes mellitus with diabetic chronic kidney disease: Secondary | ICD-10-CM | POA: Diagnosis not present

## 2023-11-03 ENCOUNTER — Other Ambulatory Visit: Payer: Self-pay | Admitting: Family Medicine

## 2023-11-03 DIAGNOSIS — R413 Other amnesia: Secondary | ICD-10-CM

## 2023-11-27 ENCOUNTER — Ambulatory Visit
Admission: RE | Admit: 2023-11-27 | Discharge: 2023-11-27 | Disposition: A | Discharge status: Home / Self Care | Payer: Medicare PPO | Source: Ambulatory Visit | Attending: Family Medicine | Admitting: Family Medicine

## 2023-11-27 DIAGNOSIS — R413 Other amnesia: Secondary | ICD-10-CM

## 2023-11-27 DIAGNOSIS — R296 Repeated falls: Secondary | ICD-10-CM | POA: Diagnosis not present

## 2023-12-21 DIAGNOSIS — N1831 Chronic kidney disease, stage 3a: Secondary | ICD-10-CM | POA: Diagnosis not present

## 2023-12-21 DIAGNOSIS — E1142 Type 2 diabetes mellitus with diabetic polyneuropathy: Secondary | ICD-10-CM | POA: Diagnosis not present

## 2024-01-04 DIAGNOSIS — M4802 Spinal stenosis, cervical region: Secondary | ICD-10-CM | POA: Diagnosis not present

## 2024-01-04 DIAGNOSIS — E1142 Type 2 diabetes mellitus with diabetic polyneuropathy: Secondary | ICD-10-CM | POA: Diagnosis not present

## 2024-01-04 DIAGNOSIS — M519 Unspecified thoracic, thoracolumbar and lumbosacral intervertebral disc disorder: Secondary | ICD-10-CM | POA: Diagnosis not present

## 2024-01-17 DIAGNOSIS — E1142 Type 2 diabetes mellitus with diabetic polyneuropathy: Secondary | ICD-10-CM | POA: Diagnosis not present

## 2024-01-17 DIAGNOSIS — M519 Unspecified thoracic, thoracolumbar and lumbosacral intervertebral disc disorder: Secondary | ICD-10-CM | POA: Diagnosis not present

## 2024-01-17 DIAGNOSIS — M4802 Spinal stenosis, cervical region: Secondary | ICD-10-CM | POA: Diagnosis not present

## 2024-01-20 DIAGNOSIS — R262 Difficulty in walking, not elsewhere classified: Secondary | ICD-10-CM | POA: Diagnosis not present

## 2024-01-24 ENCOUNTER — Other Ambulatory Visit: Payer: Self-pay | Admitting: Family Medicine

## 2024-01-24 DIAGNOSIS — R262 Difficulty in walking, not elsewhere classified: Secondary | ICD-10-CM | POA: Diagnosis not present

## 2024-01-24 DIAGNOSIS — Z1231 Encounter for screening mammogram for malignant neoplasm of breast: Secondary | ICD-10-CM

## 2024-01-27 DIAGNOSIS — R262 Difficulty in walking, not elsewhere classified: Secondary | ICD-10-CM | POA: Diagnosis not present

## 2024-01-31 DIAGNOSIS — R262 Difficulty in walking, not elsewhere classified: Secondary | ICD-10-CM | POA: Diagnosis not present

## 2024-02-04 ENCOUNTER — Ambulatory Visit
Admission: RE | Admit: 2024-02-04 | Discharge: 2024-02-04 | Disposition: A | Discharge status: Home / Self Care | Payer: Medicare PPO | Source: Ambulatory Visit | Attending: Family Medicine | Admitting: Family Medicine

## 2024-02-04 DIAGNOSIS — Z1231 Encounter for screening mammogram for malignant neoplasm of breast: Secondary | ICD-10-CM | POA: Diagnosis not present

## 2024-02-04 DIAGNOSIS — R262 Difficulty in walking, not elsewhere classified: Secondary | ICD-10-CM | POA: Diagnosis not present

## 2024-02-07 DIAGNOSIS — R262 Difficulty in walking, not elsewhere classified: Secondary | ICD-10-CM | POA: Diagnosis not present

## 2024-02-11 DIAGNOSIS — R262 Difficulty in walking, not elsewhere classified: Secondary | ICD-10-CM | POA: Diagnosis not present

## 2024-02-14 DIAGNOSIS — R262 Difficulty in walking, not elsewhere classified: Secondary | ICD-10-CM | POA: Diagnosis not present

## 2024-02-15 ENCOUNTER — Ambulatory Visit: Payer: Medicare PPO | Admitting: Neurology

## 2024-02-17 DIAGNOSIS — R262 Difficulty in walking, not elsewhere classified: Secondary | ICD-10-CM | POA: Diagnosis not present

## 2024-02-21 DIAGNOSIS — R262 Difficulty in walking, not elsewhere classified: Secondary | ICD-10-CM | POA: Diagnosis not present

## 2024-02-21 DIAGNOSIS — Z6829 Body mass index (BMI) 29.0-29.9, adult: Secondary | ICD-10-CM | POA: Diagnosis not present

## 2024-02-21 DIAGNOSIS — M47816 Spondylosis without myelopathy or radiculopathy, lumbar region: Secondary | ICD-10-CM | POA: Diagnosis not present

## 2024-02-24 DIAGNOSIS — R262 Difficulty in walking, not elsewhere classified: Secondary | ICD-10-CM | POA: Diagnosis not present

## 2024-02-28 DIAGNOSIS — R262 Difficulty in walking, not elsewhere classified: Secondary | ICD-10-CM | POA: Diagnosis not present

## 2024-03-02 DIAGNOSIS — R262 Difficulty in walking, not elsewhere classified: Secondary | ICD-10-CM | POA: Diagnosis not present

## 2024-03-06 DIAGNOSIS — R262 Difficulty in walking, not elsewhere classified: Secondary | ICD-10-CM | POA: Diagnosis not present

## 2024-03-09 DIAGNOSIS — R262 Difficulty in walking, not elsewhere classified: Secondary | ICD-10-CM | POA: Diagnosis not present

## 2024-03-13 ENCOUNTER — Ambulatory Visit: Payer: Medicare PPO | Admitting: Neurology

## 2024-03-13 DIAGNOSIS — M1990 Unspecified osteoarthritis, unspecified site: Secondary | ICD-10-CM | POA: Diagnosis not present

## 2024-03-13 DIAGNOSIS — E1122 Type 2 diabetes mellitus with diabetic chronic kidney disease: Secondary | ICD-10-CM | POA: Diagnosis not present

## 2024-03-13 DIAGNOSIS — E11620 Type 2 diabetes mellitus with diabetic dermatitis: Secondary | ICD-10-CM | POA: Diagnosis not present

## 2024-03-13 DIAGNOSIS — E785 Hyperlipidemia, unspecified: Secondary | ICD-10-CM | POA: Diagnosis not present

## 2024-03-13 DIAGNOSIS — E1142 Type 2 diabetes mellitus with diabetic polyneuropathy: Secondary | ICD-10-CM | POA: Diagnosis not present

## 2024-03-13 DIAGNOSIS — R262 Difficulty in walking, not elsewhere classified: Secondary | ICD-10-CM | POA: Diagnosis not present

## 2024-03-13 DIAGNOSIS — N182 Chronic kidney disease, stage 2 (mild): Secondary | ICD-10-CM | POA: Diagnosis not present

## 2024-03-13 DIAGNOSIS — Z8249 Family history of ischemic heart disease and other diseases of the circulatory system: Secondary | ICD-10-CM | POA: Diagnosis not present

## 2024-03-13 DIAGNOSIS — G4733 Obstructive sleep apnea (adult) (pediatric): Secondary | ICD-10-CM | POA: Diagnosis not present

## 2024-03-13 DIAGNOSIS — K219 Gastro-esophageal reflux disease without esophagitis: Secondary | ICD-10-CM | POA: Diagnosis not present

## 2024-03-15 DIAGNOSIS — R197 Diarrhea, unspecified: Secondary | ICD-10-CM | POA: Diagnosis not present

## 2024-03-15 DIAGNOSIS — S61101A Unspecified open wound of right thumb with damage to nail, initial encounter: Secondary | ICD-10-CM | POA: Diagnosis not present

## 2024-03-16 DIAGNOSIS — R262 Difficulty in walking, not elsewhere classified: Secondary | ICD-10-CM | POA: Diagnosis not present

## 2024-04-10 DIAGNOSIS — E118 Type 2 diabetes mellitus with unspecified complications: Secondary | ICD-10-CM | POA: Diagnosis not present

## 2024-04-10 DIAGNOSIS — E785 Hyperlipidemia, unspecified: Secondary | ICD-10-CM | POA: Diagnosis not present

## 2024-04-17 DIAGNOSIS — E785 Hyperlipidemia, unspecified: Secondary | ICD-10-CM | POA: Diagnosis not present

## 2024-04-17 DIAGNOSIS — E118 Type 2 diabetes mellitus with unspecified complications: Secondary | ICD-10-CM | POA: Diagnosis not present

## 2024-04-17 DIAGNOSIS — E1142 Type 2 diabetes mellitus with diabetic polyneuropathy: Secondary | ICD-10-CM | POA: Diagnosis not present

## 2024-04-17 DIAGNOSIS — I10 Essential (primary) hypertension: Secondary | ICD-10-CM | POA: Diagnosis not present

## 2024-04-20 DIAGNOSIS — H00023 Hordeolum internum right eye, unspecified eyelid: Secondary | ICD-10-CM | POA: Diagnosis not present

## 2024-05-01 DIAGNOSIS — E119 Type 2 diabetes mellitus without complications: Secondary | ICD-10-CM | POA: Diagnosis not present

## 2024-05-22 ENCOUNTER — Ambulatory Visit: Admitting: Neurology

## 2024-05-22 ENCOUNTER — Encounter: Payer: Self-pay | Admitting: Neurology

## 2024-05-22 VITALS — BP 112/60 | HR 80 | Ht 62.0 in | Wt 162.8 lb

## 2024-05-22 DIAGNOSIS — M2550 Pain in unspecified joint: Secondary | ICD-10-CM | POA: Insufficient documentation

## 2024-05-22 DIAGNOSIS — W19XXXS Unspecified fall, sequela: Secondary | ICD-10-CM

## 2024-05-22 DIAGNOSIS — R413 Other amnesia: Secondary | ICD-10-CM | POA: Diagnosis not present

## 2024-05-22 DIAGNOSIS — R5382 Chronic fatigue, unspecified: Secondary | ICD-10-CM | POA: Insufficient documentation

## 2024-05-22 DIAGNOSIS — W19XXXA Unspecified fall, initial encounter: Secondary | ICD-10-CM | POA: Insufficient documentation

## 2024-05-22 NOTE — Progress Notes (Addendum)
 Paper referral per [facility] PCP for memory.  Patient arrived at appointment time ,not requested arrival time-  leaving no time for memory testing. All memory patient are requested to arrive 30 minutes earlier for testing with my assistant .  This visit may not be completed today.  The patient was not tested by MMSE / MOCA.  She went to her sleep doctor's address instead and therefor got lost.    [facility]    SLEEP MEDICINE , NEUROLOGY :   April Banner, MD  Referring Provider: Lanae Pinal, MD Primary Care Physician:  April Pinal, MD  Chief Complaint  Patient presents with   New Patient (Initial Visit)    RM 2, Pt here alone and late arrival. Pt referred by PCP regarding memory. Pt states she may forget things at times but does not think she has a problem. Patient reports she went to the wrong office today first because "I didn't get the correct address in the mail".     HPI:  April Jones is a 78 y.o. female and seen here upon referral from April Jones for a Consultation/ Evaluation of memory loss, but the patient denies that there is any worrisome memory loss. "Some things just are not important enough to remember"  .This patient reports onset of concerns ( not from her - but from her daughter's concern as voiced towards PCP )  over a period of months ." "My Baby daughter wants me to check things out , but I am here by myself" .   This patient grew up near [facility] and after she married moved with her husband to [facility], "I am widowed now"-  ( married for 47  years, he died 13 years ago) and I have 3 daughters in this area" She moved here 7 years ago and  gave up her own apartment in March 2024. She lives "rotational" - 3 weeks at each daughter's home. She is a HS graduate and college educated, retired from [employer].  April Jones states that she used to smoke but this is remote- she quit in over 40 years ago, and she used to drink alcohol   socially, ( at parties)  and is currently not consuming any, she is no longer a consumer of caffeine, gave up her AM cup.  The patient endorsed all activities of daily living that she is independent and this includes bathing dressing grooming toilet eating transferring mobility shopping cooking managing housework laundry driving, managing her own finances and managing communication she does use the phone email or communicate otherwise with people.   She reports she knows her PIN numbers, her social security number,and reports that she is able to drive and keep appointments. Yet , she then mentioned she noted the wrong data and she has been today late because she drove first to the wrong office.    I do not have an outside opinion or independent observation here from any of her daughters, she does not endorse any depression . ( I wish there would be a person accompanying her ).  I looked at her medication list.  Her primary neurologist has been with [facility] neurology.    She was just recently tested by Dr. Kieran Jones for obstructive sleep apnea, apparently the patient was excessively daytime sleepy.  HST was obtained, she started treatment with CPAP.  Sleeps much better on treatment , I encourage her to be compliant for overall health and energy.  She has been treated for DM- neuropathy  by April Jones ,her primary care physician, she has been on Ozempic 1 mg weekly.    Family History: she has 3 healthy living daughters,  she had 3 sisters,  1 is deceased of liver disease.       Review of Systems: Out of a complete 14 system review, the patient complains of only the following symptoms, and all other reviewed systems are negative.  MMSE in November by PCP was described as  2 out of 3 words recalled.  No score mentioned . 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  She drives alone !  She had told her PCP that she doesn't drive alone.       Social History   Socioeconomic History   Marital  status: Widowed    Spouse name: Not on file   Number of children: 3   Years of education: 73   Highest education level: Not on file  Occupational History   Not on file  Tobacco Use   Smoking status: Never   Smokeless tobacco: Never  Vaping Use   Vaping status: Never Used  Substance and Sexual Activity   Alcohol  use: No   Drug use: No   Sexual activity: Not on file  Other Topics Concern   Not on file  Social History Narrative   Not on file   Social Drivers of Health   Financial Resource Strain: Not on file  Food Insecurity: Not on file  Transportation Needs: Not on file  Physical Activity: Not on file  Stress: Not on file  Social Connections: Not on file  Intimate Partner Violence: Not on file    Family History  Problem Relation Age of Onset   Breast cancer Sister     Past Medical History:  Diagnosis Date   Arthritis    Diabetes mellitus without complication (HCC)    type 2    Hypertension     Past Surgical History:  Procedure Laterality Date   BREAST BIOPSY Right 1978   BREAST EXCISIONAL BIOPSY Right    ganglion cysts removal      KNEE ARTHROPLASTY Right 09/25/2020   Procedure: COMPUTER ASSISTED TOTAL KNEE ARTHROPLASTY;  Surgeon: Adonica Hoose, MD;  Location: [facility] ORS;  Service: Orthopedics;  Laterality: Right;   left hip replacement     right thigh surgery      TONSILLECTOMY      Current Outpatient Medications  Medication Sig Dispense Refill   ACCU-CHEK AVIVA PLUS test strip USE TO CHECK BLOOD SUGARS BID  3   acetaminophen  (TYLENOL ) 500 MG tablet Take 2 tablets (1,000 mg total) by mouth every 6 (six) hours as needed for moderate pain. 30 tablet 0   amLODipine  (NORVASC ) 5 MG tablet Take 5 mg by mouth daily.      atorvastatin  (LIPITOR) 10 MG tablet Take 10 mg by mouth daily.   5   calcipotriene (DOVONOX) 0.005 % ointment Apply 1 application topically 2 (two) times daily as needed (psoriasis).      cholecalciferol  (VITAMIN D ) 25 MCG (1000 UNIT) tablet  Take 1,000 Units by mouth daily.     clobetasol ointment (TEMOVATE) 0.05 % Apply 1 application topically 2 (two) times daily as needed (psoriasis).   2   clotrimazole -betamethasone  (LOTRISONE ) cream Apply 1 Application topically 2 (two) times daily. 45 g 1   DULoxetine  (CYMBALTA ) 60 MG capsule Take 60 mg by mouth in the morning and at bedtime.      folic acid (FOLVITE) 1 MG tablet  Take 1 mg by mouth daily.     furosemide  (LASIX ) 40 MG tablet Take 40 mg by mouth daily.   1   gabapentin  (NEURONTIN ) 400 MG capsule Take 400 mg by mouth at bedtime.     JARDIANCE  25 MG TABS tablet Take 25 mg by mouth daily.   5   loratadine  (CLARITIN ) 10 MG tablet Take 10 mg by mouth daily.   1   Magnesium  400 MG TABS Take 400 mg by mouth daily. Bone Health     metFORMIN  HCl 750 MG TABS Take 750 mg by mouth in the morning and at bedtime. Morning & Afternoon  1   metoprolol  succinate (TOPROL -XL) 100 MG 24 hr tablet Take 100 mg by mouth daily.   1   omeprazole (PRILOSEC) 20 MG capsule Take 20 mg by mouth daily.   1   ondansetron  (ZOFRAN ) 4 MG tablet Take 1 tablet (4 mg total) by mouth every 6 (six) hours as needed for nausea. 20 tablet 0   OZEMPIC, 1 MG/DOSE, 4 MG/3ML SOPN Inject 1 mg into the skin every Thursday.     polyethylene glycol powder (GLYCOLAX /MIRALAX ) powder Take 17 g by mouth at bedtime.   10   pregabalin (LYRICA) 100 MG capsule Take 100 mg by mouth 2 (two) times daily.     senna (SENOKOT) 8.6 MG TABS tablet Take 1 tablet (8.6 mg total) by mouth 2 (two) times daily. 120 tablet 0   vitamin B-12 (CYANOCOBALAMIN ) 1000 MCG tablet Take 1,000 mcg by mouth daily.     XIIDRA  5 % SOLN Place 1 drop into both eyes in the morning and at bedtime.   11   celecoxib  (CELEBREX ) 100 MG capsule Take 100 mg by mouth daily. (Patient not taking: Reported on 05/22/2024)     docusate sodium  (COLACE) 100 MG capsule Take 1 capsule (100 mg total) by mouth 2 (two) times daily. (Patient not taking: Reported on 05/22/2024) 10 capsule 0    gabapentin  (NEURONTIN ) 300 MG capsule TAKE 2 CAPSULES EVERY MORNING, 2 CAPSULES AT LUNCH, 3 CAPSULES AT BEDTIME (Patient not taking: Reported on 05/22/2024) 630 capsule 2   No current facility-administered medications for this visit.    Allergies as of 05/22/2024   (No Known Allergies)  B 12, CMET, CBC all normal ,TSH , normal.   MRI 12- 2024 after a fall with "bumping" her head was normal.    Vitals: BP 112/60 (BP Location: Left Arm, Patient Position: Sitting, Cuff Size: Normal)   Pulse 80   Ht 5\' 2"  (1.575 m)   Wt 162 lb 12.8 oz (73.8 kg)   LMP  (LMP Unknown)   BMI 29.78 kg/m  Last Weight:  Wt Readings from Last 1 Encounters:  05/22/24 162 lb 12.8 oz (73.8 kg)   Last Height:   Ht Readings from Last 1 Encounters:  05/22/24 5\' 2"  (1.575 m)   Last [id number] exam:  General: The patient is awake, alert and appears not in acute distress.  The patient is well groomed. Head: Normocephalic, atraumatic.  Neck is supple.  Cardiovascular:  Regular rate and palpable peripheral pulse:  Respiratory: clear to auscultation.   Skin:  Without evidence of edema, or rash    Neurologic exam : The patient is awake and alert, oriented to place and time.   Memory subjective is described as intact.  There is a normal attention span & concentration ability.  Speech is fluent without  dysarthria, dysphonia or aphasia.  Mood and affect are appropriate. She  is cooperative , she is engaging.   Cranial nerves: Pupils are equal and briskly reactive to light. Funduscopic exam without  evidence of pallor or edema.  Cataract in early sages.  Extraocular movements  in vertical and horizontal planes intact and without nystagmus. Visual fields by finger perimetry are intact. Hearing to finger rub intact.   Facial sensation intact to fine touch.  Facial motor strength is symmetric and tongue and uvula move midline.  Motor exam:   Normal tone and normal muscle bulk and symmetric normal strength in  all extremities. Grip Strength equal  Proximal strength of shoulder muscles and hip flexors was intact .  Sensory:  Fine touch and vibration were tested .  Gait and station: Patient walked without assistive device , she could not rise from a seated position, only on the third attempt without bracing.   Core Strength poor.  Stance is stable and of normal base.   Deep tendon reflexes: in the  upper and lower extremities are symmetric and  brisk without Clonus.    Assessment: Total time for face to face interview and examination, for review of  images and laboratory testing, neurophysiology testing and pre-existing records, including out-of -network , was 35 minutes.  Assessment is as follows here: Pt states she may forget things at times but does not think she has a problem. She doesn't want any blood testing.   1)  The patient arrived  30 minutes into the time that was reserved for testing and denies concerns of any memory loss, all concerns that may have been reported to Dr April Jones she has reportedly understood as age appropriate and she states she is independent in all decisions of health care.  2) MRI was reviewed, this is an normal for age MRII, some white matter disease, some  temporal atrophy, all available  labs were reviewed.  This was just from 11-2023 .    Plan:  Treatment plan and additional workup planned after today includes:   The patient would like to follow up on memory in about 12 months ,this is upon her suggestion, a PRN visit and she is willing to undergo MOCA testing for baseline.   She is not interested in ATN testing at this time, she understood that these are bio markers for AD, She promised that she will ask one of her daughters to accompany her to any memory related visits and also to see if they want her to get genetic testing for AD, given any implication this may have for her children's generation. I asked her to wait to see if any assistant is available for a  MOCA  test, and that her RV will be put I as PRN.      April Banner, MD  [facility] and [facility] Sleep Board certified by: The [employer] of Sleep Medicine and Diplomate of the [employer] of Sleep Medicine. Board certified In Neurology through the ABPN, Fellow of the [employer] of Neurology.    Addendum: My RN  was able to offer a MOCA test after my visit with the patient had to be finalized.       05/22/2024    3:12 PM  Montreal Cognitive Assessment   Visuospatial/ Executive (0/5) 3  Naming (0/3) 2  Attention: Read list of digits (0/2) 1  Attention: Read list of letters (0/1) 1  Attention: Serial 7 subtraction starting at 100 (0/3) 0  Language: Repeat phrase (0/2) 1  Language : Fluency (0/1) 1  Abstraction (0/2) 2  Delayed Recall (0/5) 1  Orientation (0/6) 6  Total 18   Did not identify the rhinoceros, none of the serial 7 th steps was performed, only 1 of 5 words was recalled. The patient still insisted in discussion with my RN that she has no trouble with memory, seeks no adjustments in life style or any treatment.   April Banner, MD

## 2024-05-22 NOTE — Patient Instructions (Signed)
 There are well-accepted and sensible ways to reduce risk for Dementia and Alzheimer's disease and other degenerative brain disorders .  Exercise Daily Walk A daily 20 minute walk should be part of your routine. Disease related apathy can be a significant roadblock to exercise and the only way to overcome this is to make it a daily routine and perhaps have a reward at the end (something your loved one loves to eat or drink perhaps) or a personal trainer coming to the home can also be very useful.   Most importantly, the patient is much more likely to exercise if the caregiver / spouse does it with him/her. In general a structured, repetitive schedule is best.  General Health: Any diseases which effect your body will effect your brain such as a pneumonia, urinary infection, blood clot, heart attack or stroke. Keep contact with your primary care doctor for regular follow ups.  Sleep. A good nights sleep is healthy for the brain. Seven hours is recommended. If you have insomnia or poor sleep habits we can give you some instructions. If you have sleep apnea wear your mask.  Diet: Eating a heart healthy diet is also a good idea; fish and poultry instead of red meat, nuts (mostly non-peanuts), vegetables, fruits, olive oil or canola oil (instead of butter), minimal salt (use other spices to flavor foods), whole grain rice, bread, cereal and pasta and wine in moderation.Research is now showing that the MIND diet, which is a combination of The Mediterranean diet and the DASH diet, is beneficial for cognitive processing and longevity. Information about this diet can be found in The MIND Diet, a book by Andria Keeler, MS, RDN, and online at [employer]  Finances, Power of [address] and Advance Directives: You should consider putting legal safeguards in place with regard to financial and medical decision making. While the spouse always has power of attorney for medical and financial  issues in the absence of any form, you should consider what you want in case the spouse / caregiver is no longer around or capable of making decisions.   The Alzheimers Association Position on Disease Prevention  Can Alzheimer's be prevented? It's a question that continues to intrigue researchers and fuel new investigations. There are no clear-cut answers yet -- partially due to the need for more large-scale studies in diverse populations -- but promising research is under way. The Alzheimer's Association is leading the worldwide effort to find a treatment for Alzheimer's, delay its onset and prevent it from developing.   What causes Alzheimer's? Experts agree that in the vast majority of cases, Alzheimer's, like other common chronic conditions, probably develops as a result of complex interactions among multiple factors, including age, genetics, environment, lifestyle and coexisting medical conditions. Although some risk factors -- such as age or genes -- cannot be changed, other risk factors -- such as high blood pressure and lack of exercise -- usually can be changed to help reduce risk. Research in these areas may lead to new ways to detect those at highest risk.  Prevention studies A small percentage of people with Alzheimer's disease (less than 1 percent) have an early-onset type associated with genetic mutations. Individuals who have these genetic mutations are guaranteed to develop the disease. An ongoing clinical trial conducted by the Domin[facility] Inherited Alzheimer Network (DIAN), is testing whether antibodies to beta-amyloid can reduce the accumulation of beta-amyloid plaque in the brains of people with such genetic mutations and thereby reduce, delay or prevent symptoms. Participants in the  trial are receiving antibodies (or placebo) before they develop symptoms, and the development of beta-amyloid plaques is being monitored by brain scans and other tests.  Another clinical trial, known as  the A4 trial (Anti-Amyloid Treatment in Asymptomatic Alzheimer's), is testing whether antibodies to beta-amyloid can reduce the risk of Alzheimer's disease in older people (ages 50 to 39) at high risk for the disease. The A4 trial is being conducted by the Alzheimer's Disease Cooperative Study.  Though research is still evolving, evidence is strong that people can reduce their risk by making key lifestyle changes, including participating in regular activity and maintaining good heart health. Based on this research, the Alzheimer's Association offers 10 Ways to Love Your Brain -- a collection of tips that can reduce the risk of cognitive decline.  Heart-head connection  New research shows there are things we can do to reduce the risk of mild cognitive impairment and dementia.  Several conditions known to increase the risk of cardiovascular disease -- such as high blood pressure, diabetes and high cholesterol -- also increase the risk of developing Alzheimer's. Some autopsy studies show that as many as 80 percent of individuals with Alzheimer's disease also have cardiovascular disease.  A longstanding question is why some people develop hallmark Alzheimer's plaques and tangles but do not develop the symptoms of Alzheimer's. Vascular disease may help researchers eventually find an answer. Some autopsy studies suggest that plaques and tangles may be present in the brain without causing symptoms of cognitive decline unless the brain also shows evidence of vascular disease. More research is needed to better understand the link between vascular health and Alzheimer's.  Physical exercise and diet Regular physical exercise may be a beneficial strategy to lower the risk of Alzheimer's and vascular dementia. Exercise may directly benefit brain cells by increasing blood and oxygen flow in the brain. Because of its known cardiovascular benefits, a medically approved exercise program is a valuable part of any overall  wellness plan.  Current evidence suggests that heart-healthy eating may also help protect the brain. Heart-healthy eating includes limiting the intake of sugar and saturated fats and making sure to eat plenty of fruits, vegetables, and whole grains. No one diet is best. Two diets that have been studied and may be beneficial are the DASH (Dietary Approaches to Stop Hypertension) diet and the Mediterranean diet. The DASH diet emphasizes vegetables, fruits and fat-free or low-fat dairy products; includes whole grains, fish, poultry, beans, seeds, nuts and vegetable oils; and limits sodium, sweets, sugary beverages and red meats. A Mediterranean diet includes relatively little red meat and emphasizes whole grains, fruits and vegetables, fish and shellfish, and nuts, olive oil and other healthy fats.  Social connections and intellectual activity A number of studies indicate that maintaining strong social connections and keeping mentally active as we age might lower the risk of cognitive decline and Alzheimer's. Experts are not certain about the reason for this association. It may be due to direct mechanisms through which social and mental stimulation strengthen connections between nerve cells in the brain.  Head trauma There appears to be a strong link between future risk of dementia and serious head trauma, especially when injury involves loss of consciousness. You can help reduce your risk of Alzheimer's by protecting your head.  Wear a seat belt  Use a helmet when participating in sports  "Fall-proof" your home   What you can do now While research is not yet conclusive, certain lifestyle choices, such as physical activity and diet,  may help support brain health and prevent Alzheimer's. Many of these lifestyle changes have been shown to lower the risk of other diseases, like heart disease and diabetes, which have been linked to Alzheimer's. With few drawbacks and plenty of known benefits, healthy  lifestyle choices can improve your health and possibly protect your brain.  Learn more about brain health. You can help increase our knowledge by considering participation in a clinical study. Our free clinical trial matching services, TrialMatch, can help you find clinical trials in your area that are seeking volunteers.  Understanding prevention research Here are some things to keep in mind about the research underlying much of our current knowledge about possible prevention:  Insights about potentially modifiable risk factors apply to large population groups, not to individuals. Studies can show that factor X is associated with outcome Y, but cannot guarantee that any specific person will have that outcome. As a result, you can "do everything right" and still have a serious health problem or "do everything wrong" and live to be 100.  Much of our current evidence comes from large epidemiological studies such as the [place] Aging Study, the Nurses' Health Study, the Adult Changes in Thought Study and the [employer]. These studies explore pre-existing behaviors and use statistical methods to relate those behaviors to health outcomes. This type of study can show an "association" between a factor and an outcome but cannot "prove" cause and effect. This is why we describe evidence based on these studies with such language as "suggests," "may show," "might protect," and "is associated with."  The gold standard for showing cause and effect is a clinical trial in which participants are randomly assigned to a prevention or risk management strategy or a control group. Researchers follow the two groups over time to see if their outcomes differ significantly.  It is unlikely that some prevention or risk management strategies will ever be tested in randomized trials for ethical or practical reasons. One example is exercise. Definitively testing the impact of exercise on Alzheimer's risk would require  a huge trial enrolling thousands of people and following them for many years. The expense and logistics of such a trial would be prohibitive, and it would require some people to go without exercise, a known health benefit.

## 2024-06-07 ENCOUNTER — Ambulatory Visit: Admitting: Neurology

## 2024-06-07 DIAGNOSIS — L400 Psoriasis vulgaris: Secondary | ICD-10-CM | POA: Diagnosis not present

## 2024-06-14 DIAGNOSIS — M5090 Cervical disc disorder, unspecified, unspecified cervical region: Secondary | ICD-10-CM | POA: Diagnosis not present

## 2024-06-14 DIAGNOSIS — Z79899 Other long term (current) drug therapy: Secondary | ICD-10-CM | POA: Diagnosis not present

## 2024-06-14 DIAGNOSIS — E1142 Type 2 diabetes mellitus with diabetic polyneuropathy: Secondary | ICD-10-CM | POA: Diagnosis not present

## 2024-06-14 DIAGNOSIS — N1831 Chronic kidney disease, stage 3a: Secondary | ICD-10-CM | POA: Diagnosis not present

## 2024-06-14 DIAGNOSIS — L409 Psoriasis, unspecified: Secondary | ICD-10-CM | POA: Diagnosis not present

## 2024-06-26 DIAGNOSIS — I10 Essential (primary) hypertension: Secondary | ICD-10-CM | POA: Diagnosis not present

## 2024-06-26 DIAGNOSIS — R609 Edema, unspecified: Secondary | ICD-10-CM | POA: Diagnosis not present

## 2024-10-11 DIAGNOSIS — G4733 Obstructive sleep apnea (adult) (pediatric): Secondary | ICD-10-CM | POA: Diagnosis not present

## 2024-11-07 DIAGNOSIS — E7800 Pure hypercholesterolemia, unspecified: Secondary | ICD-10-CM | POA: Diagnosis not present

## 2024-11-07 DIAGNOSIS — Z0000 Encounter for general adult medical examination without abnormal findings: Secondary | ICD-10-CM | POA: Diagnosis not present

## 2024-11-07 DIAGNOSIS — E1142 Type 2 diabetes mellitus with diabetic polyneuropathy: Secondary | ICD-10-CM | POA: Diagnosis not present

## 2024-11-07 DIAGNOSIS — E1122 Type 2 diabetes mellitus with diabetic chronic kidney disease: Secondary | ICD-10-CM | POA: Diagnosis not present

## 2024-11-07 DIAGNOSIS — L409 Psoriasis, unspecified: Secondary | ICD-10-CM | POA: Diagnosis not present

## 2024-11-07 DIAGNOSIS — N1831 Chronic kidney disease, stage 3a: Secondary | ICD-10-CM | POA: Diagnosis not present

## 2024-11-07 DIAGNOSIS — Z79899 Other long term (current) drug therapy: Secondary | ICD-10-CM | POA: Diagnosis not present

## 2024-11-07 DIAGNOSIS — E2839 Other primary ovarian failure: Secondary | ICD-10-CM | POA: Diagnosis not present

## 2024-11-13 ENCOUNTER — Other Ambulatory Visit (HOSPITAL_BASED_OUTPATIENT_CLINIC_OR_DEPARTMENT_OTHER): Payer: Self-pay | Admitting: Family Medicine

## 2024-11-13 DIAGNOSIS — E2839 Other primary ovarian failure: Secondary | ICD-10-CM

## 2025-05-28 ENCOUNTER — Ambulatory Visit: Admitting: Neurology
